# Patient Record
Sex: Female | Born: 1945 | Hispanic: Yes | Marital: Single | Smoking: Never smoker
Health system: Southern US, Community
[De-identification: ages and names within clinical notes are randomized; demographics above are authoritative.]

## PROBLEM LIST (undated history)

## (undated) DIAGNOSIS — D649 Anemia, unspecified: Secondary | ICD-10-CM

## (undated) DIAGNOSIS — R21 Rash and other nonspecific skin eruption: Secondary | ICD-10-CM

## (undated) DIAGNOSIS — R011 Cardiac murmur, unspecified: Secondary | ICD-10-CM

## (undated) DIAGNOSIS — E119 Type 2 diabetes mellitus without complications: Secondary | ICD-10-CM

## (undated) DIAGNOSIS — H538 Other visual disturbances: Secondary | ICD-10-CM

## (undated) DIAGNOSIS — M1990 Unspecified osteoarthritis, unspecified site: Secondary | ICD-10-CM

## (undated) DIAGNOSIS — Z5189 Encounter for other specified aftercare: Secondary | ICD-10-CM

## (undated) DIAGNOSIS — E785 Hyperlipidemia, unspecified: Secondary | ICD-10-CM

## (undated) DIAGNOSIS — K769 Liver disease, unspecified: Secondary | ICD-10-CM

## (undated) DIAGNOSIS — I10 Essential (primary) hypertension: Secondary | ICD-10-CM

## (undated) DIAGNOSIS — E039 Hypothyroidism, unspecified: Secondary | ICD-10-CM

## (undated) DIAGNOSIS — H269 Unspecified cataract: Secondary | ICD-10-CM

## (undated) DIAGNOSIS — H539 Unspecified visual disturbance: Secondary | ICD-10-CM

## (undated) HISTORY — PX: EYE SURGERY: SHX253

## (undated) HISTORY — PX: ABDOMINAL SURGERY: SHX537

---

## 1976-09-27 HISTORY — PX: TUBAL LIGATION: SHX77

## 2003-04-03 ENCOUNTER — Emergency Department: Admit: 2003-04-03 | Payer: Self-pay | Source: Ambulatory Visit | Admitting: Emergency Medicine

## 2003-06-04 ENCOUNTER — Ambulatory Visit
Admission: RE | Admit: 2003-06-04 | Disposition: A | Discharge status: Home / Self Care | Payer: Self-pay | Source: Ambulatory Visit | Admitting: Orthopaedic Surgery

## 2004-12-22 ENCOUNTER — Observation Stay
Admission: EM | Admit: 2004-12-22 | Disposition: A | Discharge status: Home / Self Care | Payer: Self-pay | Source: Ambulatory Visit | Admitting: Internal Medicine

## 2012-01-11 IMAGING — OT DEXA BONE DENSITY
2 series · 2 of 2 positions shown · non-contrast
Comparison: none

Patient Demographics:  66-year-old Hispanic female
REASON FOR EXAM: Postmenopausal screen

Risk Factors:   Previous fracture
Prior Exams:  None
Method:  Scans of the spine between L2-L4 and the femoral neck were performed using dual energy X-ray densitometry (DXA) with the [employer]-SL system.

[Series 1: — · 1 of 1 slices shown (1 of 2)]
[im 1/1]
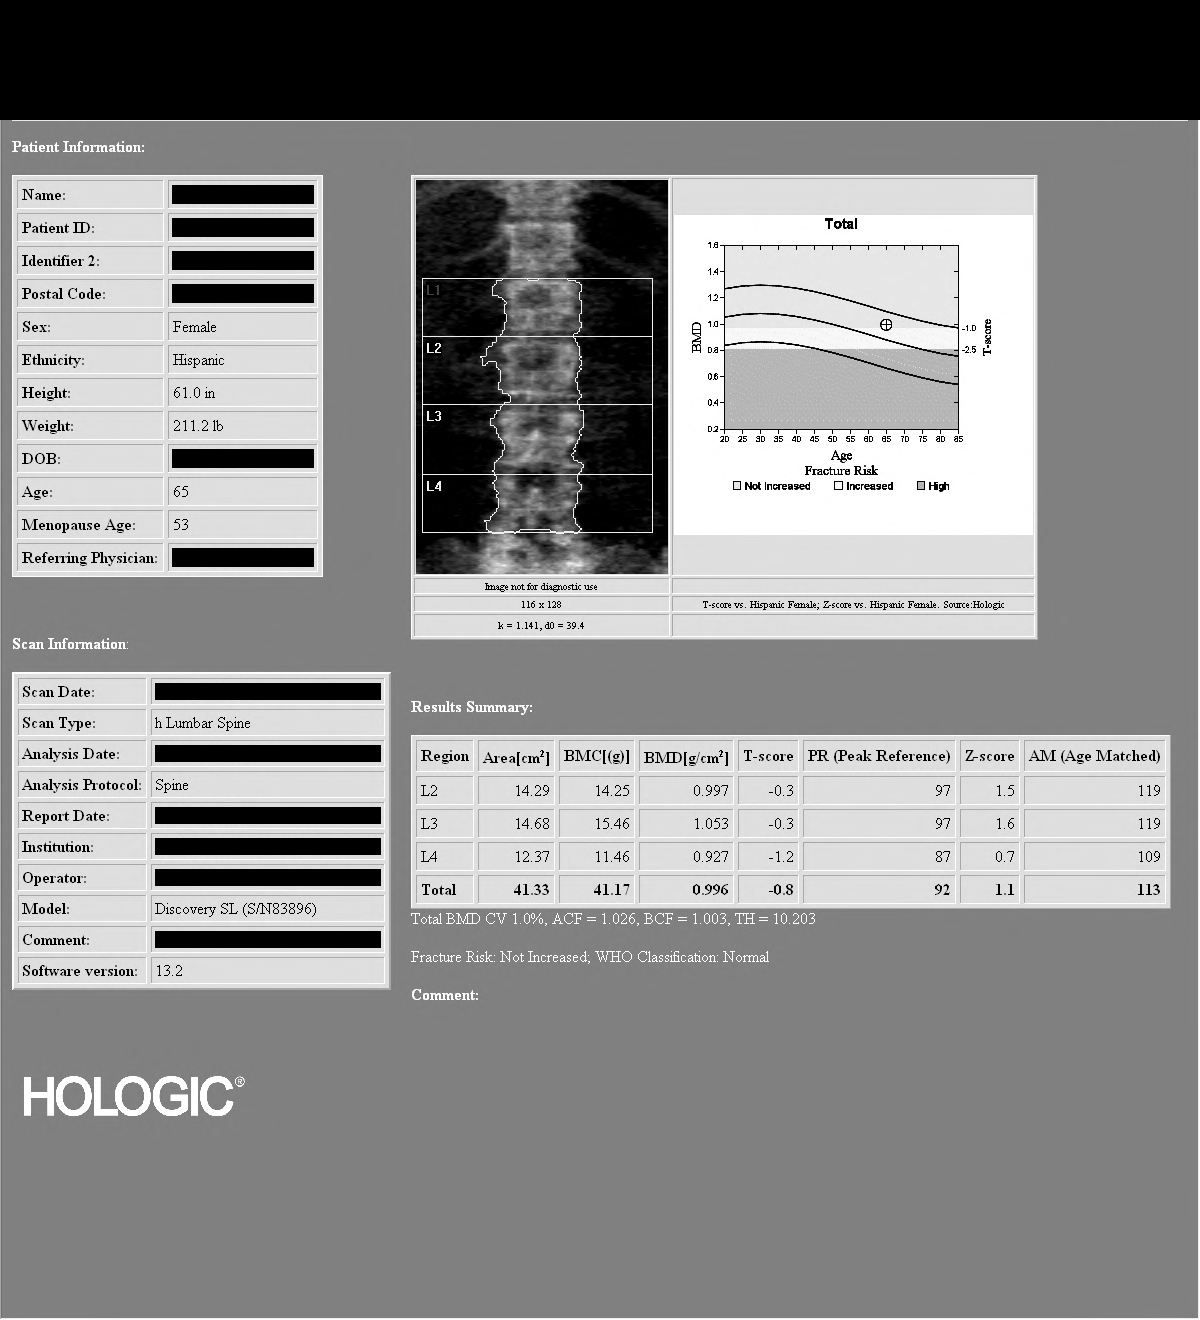

[Series 2: — · left · 1 of 1 slices shown (2 of 2)]
[im 1/1]
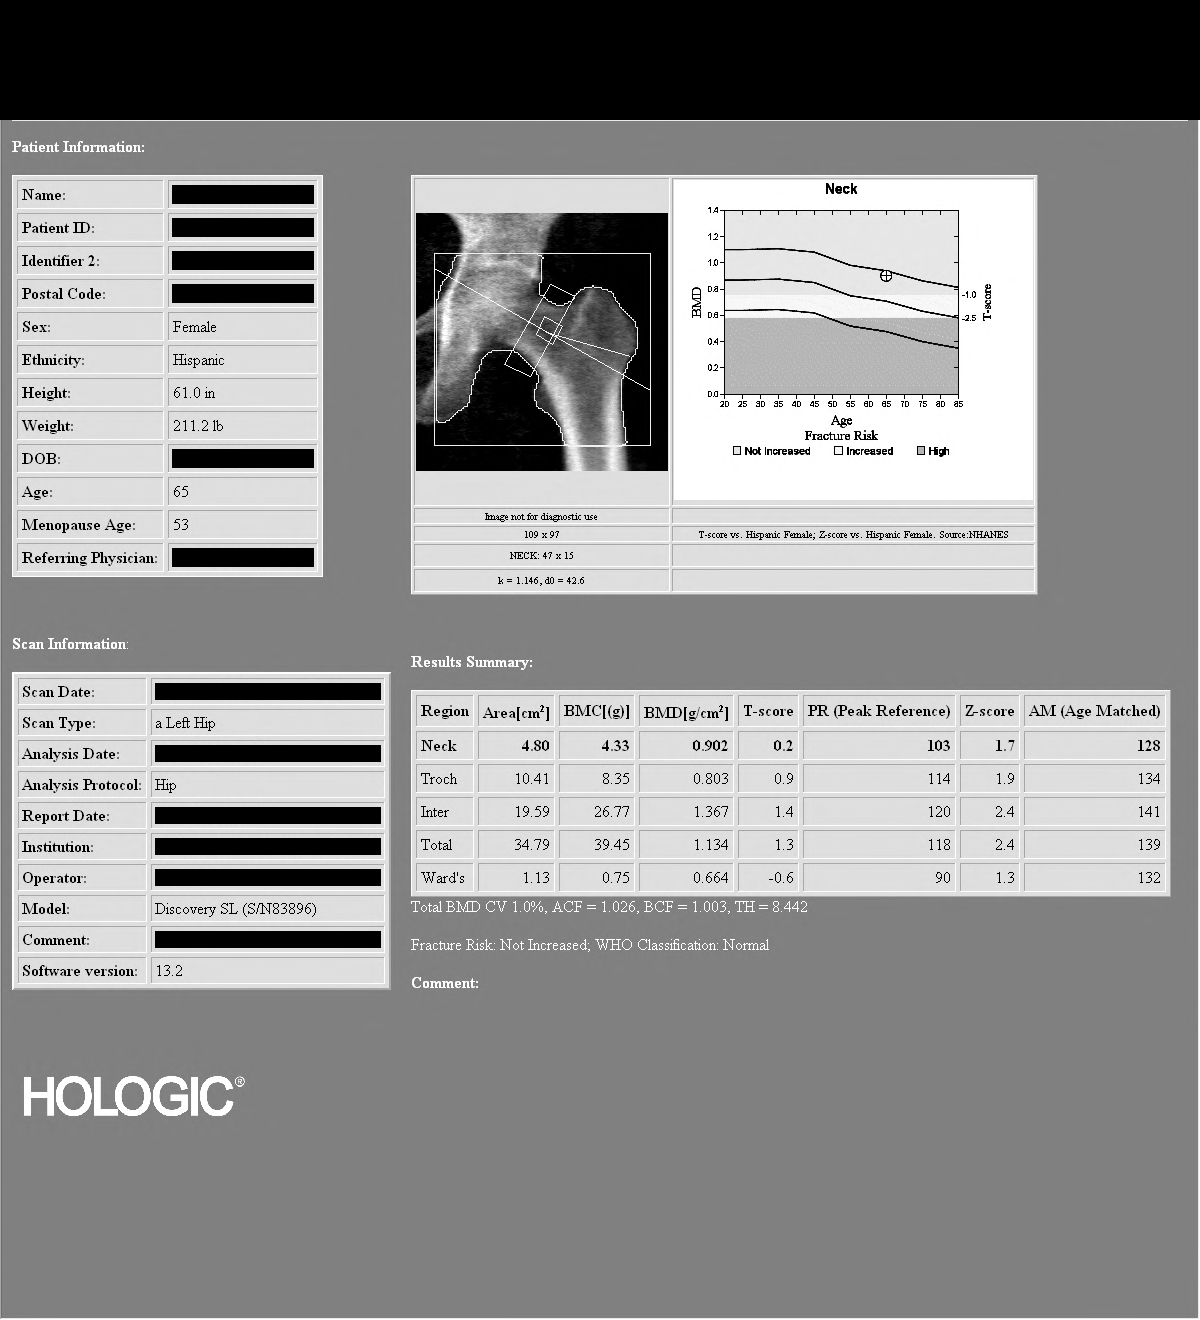

[2 of 2 positions shown; findings below may reference images not displayed]

FINDINGS: 1.    Review of scanogram images shows no factor invalidating scan results.  

2.    Lumbar spine exam shows average Bone Mineral Density is 0.996 gm/cm2 of Hydroxyapatite.  The T-score (comparing patient with a young adult group) is 0.8 standard deviation BELOW mean.  The Z-score (comparing patient with an age-matched group) is 1.1 standard deviation ABOVE mean.

3.   Left hip exam using femoral neck region of interest shows Bone Mineral Density is 0.902 gm/cm2 of Hydroxyapatite.  The T-score is 0.2 standard deviation ABOVE mean.  The Z-score is 1.7 standard deviation ABOVE mean.

Discussion:  As defined by World Health Organization, the patient meets the criteria for NORMAL BONE DENSITY based on spine and hip T-score.  

Recommendations:  In addition assuring patient receiving adequate calcium and vitamin D, which patient has indicated she is not taking supplements, regular exercise to patient tolerance would be of benefit. The patient does not smoke and is not taking any prescribed medication for bone loss.

According to criteria established by the NOF, patient DOES NOT fall into benefit category for prescribed medical therapy.

 The National Osteoporosis Foundation now recommends followup DXA scanning every two years in patients at risk regardless of whether the patient is undergoing pharmacological treatment.

World Health Organization criteria for diagnosis.  (For post-menopausal women and men over 50 years old only)

Normal Bone Mass:  T-score -1.0 and above

Low Bone Mass (Osteopenia):  T-score between -1.0 and -2.5

Osteoporosis:  T-score -2.5 and below

Recommendations for Institution of Pharmacologic Therapy National Osteoporosis Foundation 5779

Post-menopausal women and men age 50 and older presenting with the following should be treated:  

Prior hip or vertebral (clinical or morphometric) fracture.

T-score < -2.5 at the femoral neck, total hip or spine after appropriate evaluation to exclude secondary causes for osteoporosis.

Other prior fractures and low bone mass (T-score between -1.0 and -2.5 at the femoral neck, total hip or spine).

Low bone mass (T-score between -1.0 and -2.5 at the femoral neck, total hip or spine) and secondary causes associated with high risk of fracture (such as prolonged glucocorticoid use or total immobilization).

Low bone mass (T-score between -1.0 and -2.5 at the femoral neck, total hip or spine) and 10- yr of hip fracture > 3% or a 10-yr probability of any major osteoporosis-related fracture =20% based on the [place]adapted WHO algorithm.

## 2012-01-11 IMAGING — CR CHEST 2 VWS PA LAT
1 series · 2 of 2 positions shown · non-contrast
Comparison: none

HISTORY: Hypertension , diabetes

[Series 1: pa · 0.17mm/px · 2 of 2 slices shown]
[im 1/2]
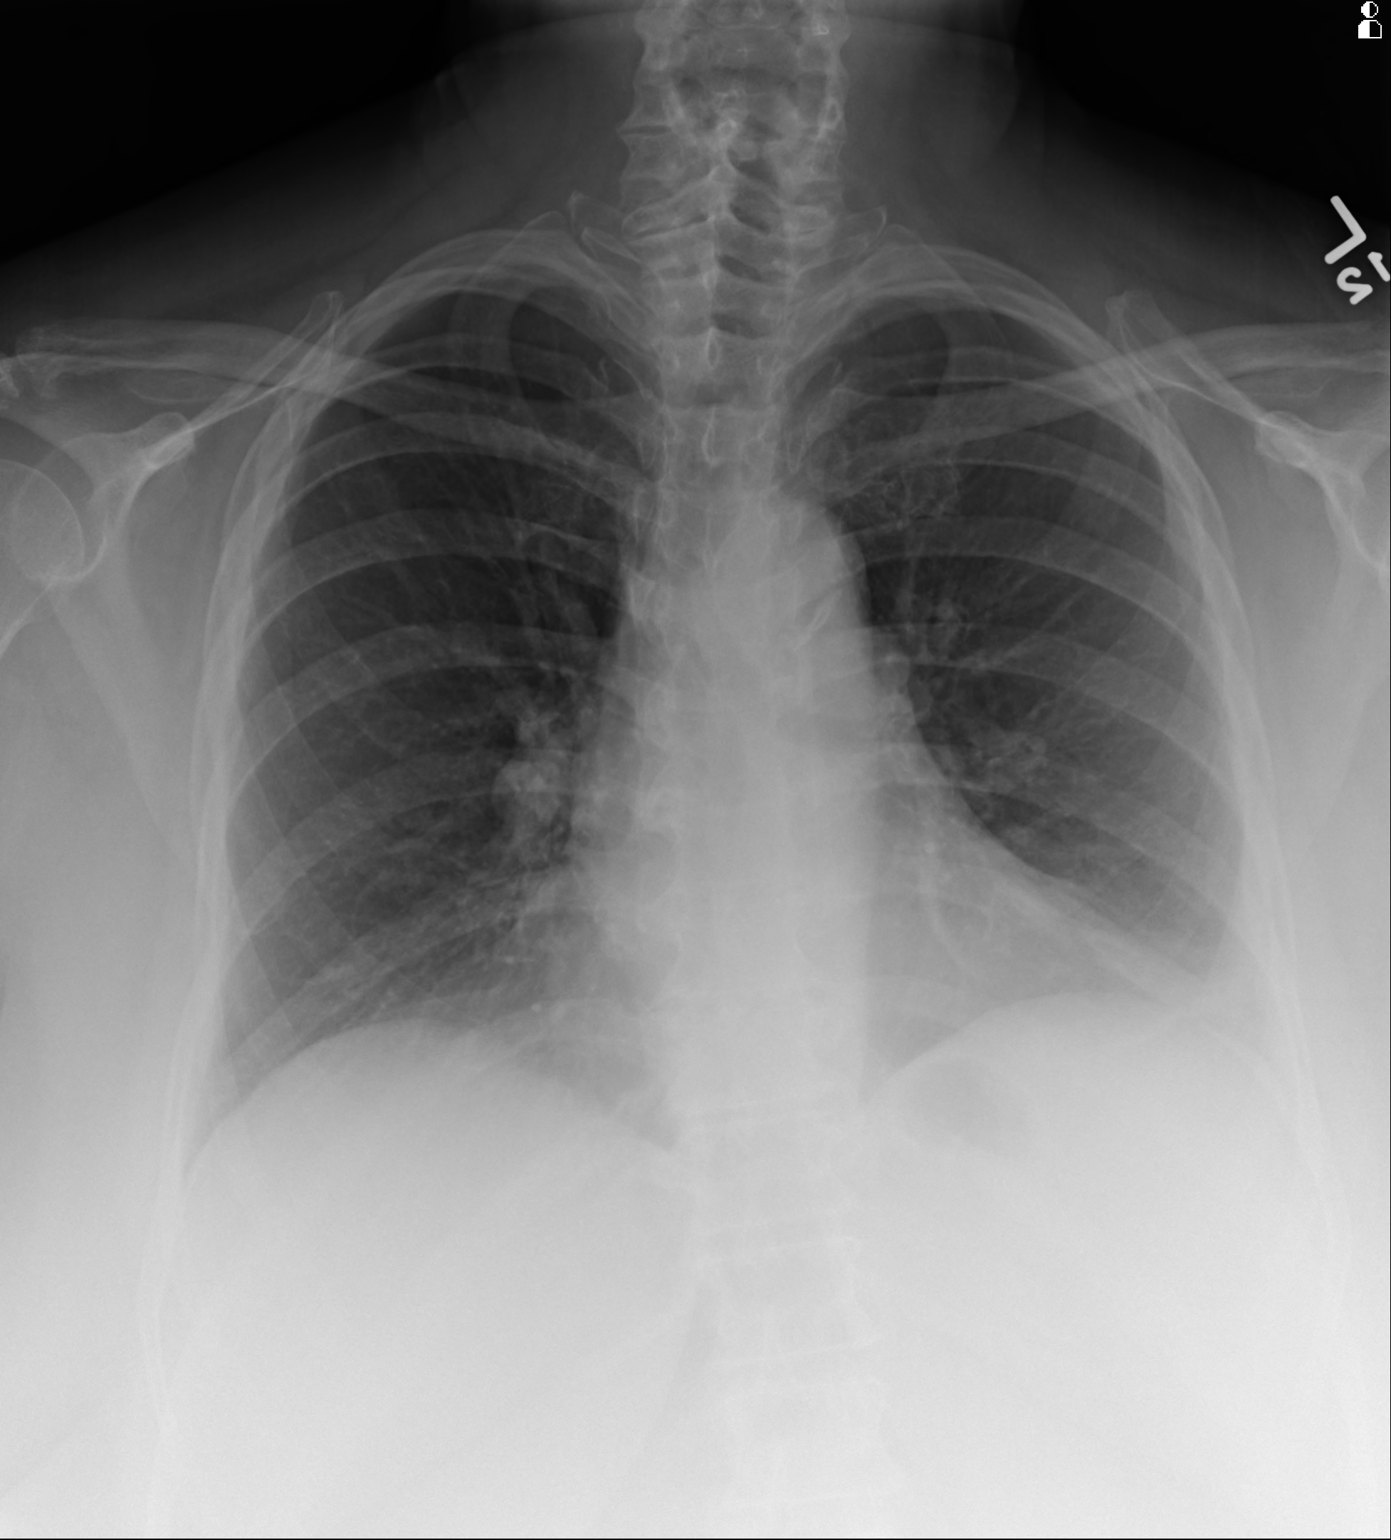
[im 2/2]
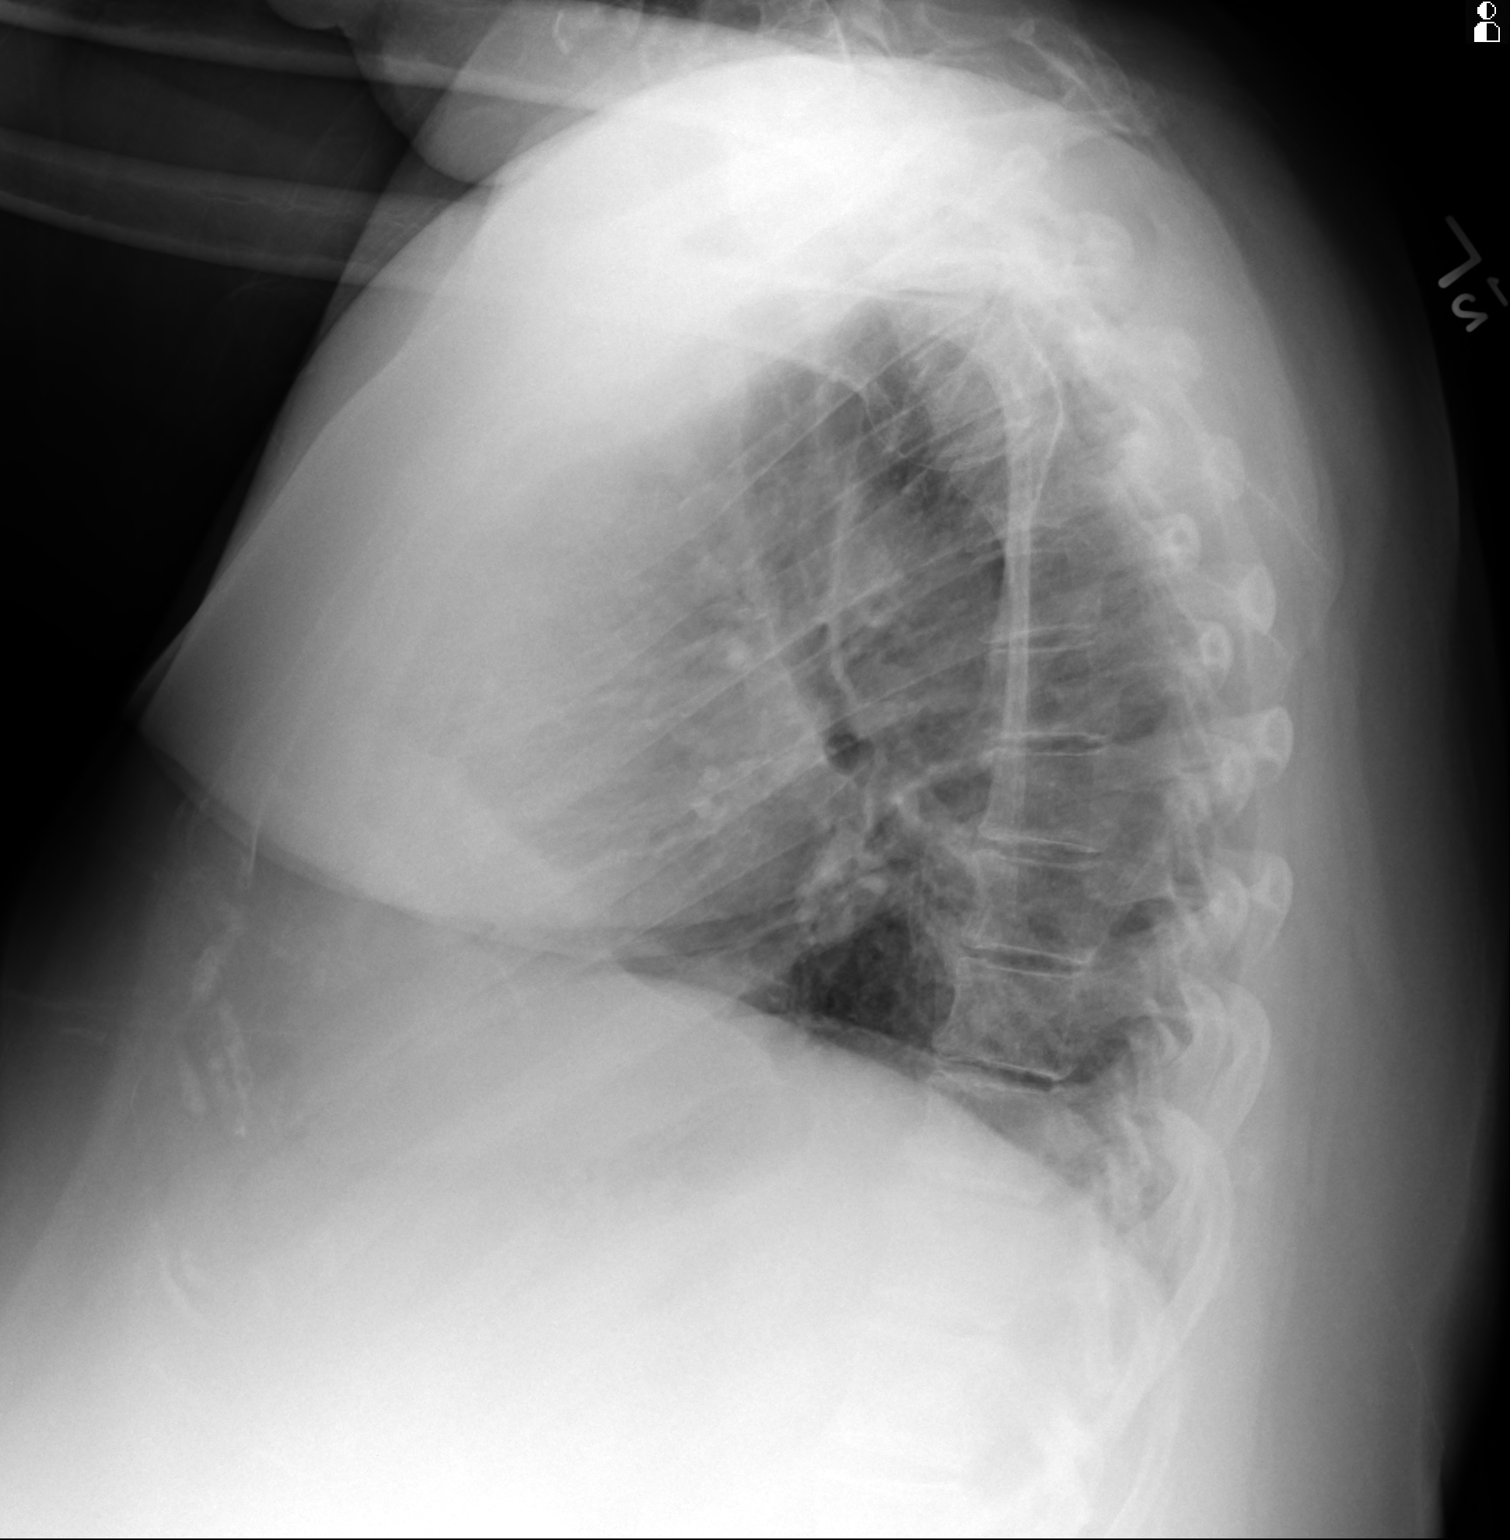

[2 of 2 positions shown; findings below may reference images not displayed]

FINDINGS: Cardiac size and pulmonary vascularity are normal.  The mediastinal silhouette is unremarkable.  The lungs are normal, and there is no pleural fluid.
IMPRESSION: : 

Normal chest.

## 2012-01-21 ENCOUNTER — Emergency Department
Admit: 2012-01-21 | Discharge: 2012-01-21 | Disposition: A | Discharge status: Home / Self Care | Payer: Self-pay | Source: Emergency Department | Admitting: Pediatric Emergency Medicine

## 2012-05-28 ENCOUNTER — Emergency Department: Payer: Medicare Other

## 2012-05-28 ENCOUNTER — Emergency Department
Admission: EM | Admit: 2012-05-28 | Discharge: 2012-05-28 | Disposition: A | Discharge status: Home / Self Care | Payer: Medicare Other | Attending: Emergency Medicine | Admitting: Emergency Medicine

## 2012-05-28 DIAGNOSIS — E079 Disorder of thyroid, unspecified: Secondary | ICD-10-CM | POA: Insufficient documentation

## 2012-05-28 DIAGNOSIS — I10 Essential (primary) hypertension: Secondary | ICD-10-CM | POA: Insufficient documentation

## 2012-05-28 DIAGNOSIS — M129 Arthropathy, unspecified: Secondary | ICD-10-CM | POA: Insufficient documentation

## 2012-05-28 DIAGNOSIS — E119 Type 2 diabetes mellitus without complications: Secondary | ICD-10-CM | POA: Insufficient documentation

## 2012-05-28 DIAGNOSIS — E785 Hyperlipidemia, unspecified: Secondary | ICD-10-CM | POA: Insufficient documentation

## 2012-05-28 DIAGNOSIS — R509 Fever, unspecified: Secondary | ICD-10-CM

## 2012-05-28 DIAGNOSIS — R42 Dizziness and giddiness: Secondary | ICD-10-CM | POA: Insufficient documentation

## 2012-05-28 HISTORY — DX: Unspecified osteoarthritis, unspecified site: M19.90

## 2012-05-28 HISTORY — DX: Essential (primary) hypertension: I10

## 2012-05-28 HISTORY — DX: Hyperlipidemia, unspecified: E78.5

## 2012-05-28 LAB — CBC AND DIFFERENTIAL
Basophils Absolute Automated: 0.02 10*3/uL (ref 0.00–0.20)
Basophils Automated: 0 % (ref 0–2)
Eosinophils Absolute Automated: 0.04 10*3/uL (ref 0.00–0.70)
Eosinophils Automated: 0 % (ref 0–5)
Hematocrit: 43.7 % (ref 37.0–47.0)
Hgb: 15 g/dL (ref 12.0–16.0)
Immature Granulocytes Absolute: 0.02 10*3/uL
Immature Granulocytes: 0 % (ref 0–1)
Lymphocytes Absolute Automated: 1.95 10*3/uL (ref 0.50–4.40)
Lymphocytes Automated: 19 % (ref 15–41)
MCH: 29.5 pg (ref 28.0–32.0)
MCHC: 34.3 g/dL (ref 32.0–36.0)
MCV: 85.9 fL (ref 80.0–100.0)
MPV: 12.1 fL (ref 9.4–12.3)
Monocytes Absolute Automated: 0.65 10*3/uL (ref 0.00–1.20)
Monocytes: 6 % (ref 0–11)
Neutrophils Absolute: 7.71 10*3/uL (ref 1.80–8.10)
Neutrophils: 74 % (ref 52–75)
Nucleated RBC: 0 /100 WBC (ref 0–1)
Platelets: 154 10*3/uL (ref 140–400)
RBC: 5.09 10*6/uL (ref 4.20–5.40)
RDW: 13 % (ref 12–15)
WBC: 10.37 10*3/uL (ref 3.50–10.80)

## 2012-05-28 LAB — URINALYSIS, REFLEX TO MICROSCOPIC EXAM IF INDICATED
Bilirubin, UA: NEGATIVE
Blood, UA: NEGATIVE
Glucose, UA: NEGATIVE
Ketones UA: NEGATIVE
Leukocyte Esterase, UA: NEGATIVE
Nitrite, UA: NEGATIVE
Protein, UR: NEGATIVE
Specific Gravity UA: 1.008 (ref 1.001–1.035)
Urine pH: 7 (ref 5.0–8.0)
Urobilinogen, UA: NEGATIVE mg/dL

## 2012-05-28 LAB — HEMOLYSIS INDEX: Hemolysis Index: 15 Index (ref 0–18)

## 2012-05-28 LAB — LACTIC ACID: Lactic Acid: 1 meq/L (ref 0.5–2.2)

## 2012-05-28 LAB — BASIC METABOLIC PANEL
Anion Gap: 13 (ref 5.0–15.0)
BUN: 14 mg/dL (ref 7.0–19.0)
CO2: 27 mEq/L (ref 22–29)
Calcium: 10.1 mg/dL (ref 8.5–10.5)
Chloride: 97 mEq/L — ABNORMAL LOW (ref 98–107)
Creatinine: 0.9 mg/dL (ref 0.6–1.0)
Glucose: 138 mg/dL — ABNORMAL HIGH (ref 70–100)
Potassium: 3.6 mEq/L (ref 3.5–5.1)
Sodium: 137 mEq/L (ref 136–145)

## 2012-05-28 LAB — GFR: EGFR: >60

## 2012-05-28 MED ORDER — ACETAMINOPHEN 500 MG PO TABS
1000.0000 mg | ORAL_TABLET | Freq: Once | ORAL | Status: AC
Start: 2012-05-28 — End: 2012-05-28
  Administered 2012-05-28: 1000 mg via ORAL
  Filled 2012-05-28: qty 2

## 2012-05-28 MED ORDER — SODIUM CHLORIDE 0.9 % IV BOLUS
1000.00 mL | Freq: Once | INTRAVENOUS | Status: AC
Start: 2012-05-28 — End: 2012-05-28
  Administered 2012-05-28: 1000 mL via INTRAVENOUS

## 2012-05-28 NOTE — ED Provider Notes (Signed)
Physician/Midlevel provider first contact with patient: 05/28/12 1731         History     Chief Complaint   Patient presents with   . Dizziness   . Fatigue   . Headache   . Numbness     The history is provided by the patient.     2 days progressive, now moderate dizziness, assocaited with headache and extremity numbness.  No identified modifying factors.    Past Medical History   Diagnosis Date   . Hypertensive disorder    . Diabetes mellitus without complication    . Hyperlipidemia    . Arthritis    . Thyroid disease        History reviewed. No pertinent past surgical history.    No family history on file.    Social  History   Substance Use Topics   . Smoking status: Not on file   . Smokeless tobacco: Not on file   . Alcohol Use:        .     No Known Allergies    Current/Home Medications    ATENOLOL-CHLORTHALIDONE (TENORETIC) 50-25 MG PER TABLET    Take 1 tablet by mouth daily.    CELECOXIB (CELEBREX) 200 MG CAPSULE    Take 200 mg by mouth 2 (two) times daily.    EZETIMIBE (ZETIA) 10 MG TABLET    Take 10 mg by mouth daily.    LEVOTHYROXINE (SYNTHROID, LEVOTHROID) 125 MCG TABLET    Take 125 mcg by mouth daily.    METFORMIN (GLUCOPHAGE) 500 MG TABLET    Take 500 mg by mouth 2 (two) times daily with meals.        Review of Systems   Constitutional: Positive for fatigue. Negative for fever and chills.   HENT: Negative for sore throat and rhinorrhea.    Eyes: Negative for pain and visual disturbance.   Respiratory: Negative for cough and shortness of breath.    Cardiovascular: Negative for chest pain and palpitations.   Gastrointestinal: Negative for nausea, vomiting, diarrhea, constipation and blood in stool.   Genitourinary: Negative for dysuria and hematuria.   Musculoskeletal: Negative for myalgias and arthralgias.   Skin: Negative for color change and rash.   Neurological: Positive for dizziness, numbness and headaches. Negative for weakness.       Physical Exam    BP 122/50  Pulse 88  Temp 101.4 F (38.6 C)   Resp 20  Wt 79.379 kg  SpO2 95%    Physical Exam   Constitutional: She is oriented to person, place, and time. She appears well-developed and well-nourished. No distress.   HENT:   Head: Normocephalic and atraumatic.   Mouth/Throat: Oropharynx is clear and moist.   Eyes: Conjunctivae normal and EOM are normal. Pupils are equal, round, and reactive to light. Right eye exhibits no discharge. Left eye exhibits no discharge. No scleral icterus.   Neck: Normal range of motion. Neck supple. No tracheal deviation present.   Cardiovascular: Normal rate, regular rhythm and normal heart sounds.  Exam reveals no gallop and no friction rub.    No murmur heard.  Pulmonary/Chest: Effort normal and breath sounds normal. No stridor. No respiratory distress. She has no wheezes. She has no rales.   Abdominal: Soft. She exhibits no distension. There is no tenderness.   Musculoskeletal: She exhibits no edema and no tenderness.   Lymphadenopathy:     She has no cervical adenopathy.   Neurological: She is alert and oriented to  person, place, and time.   Skin: Skin is warm and dry. No rash noted.   Psychiatric: She has a normal mood and affect. Her behavior is normal. Judgment and thought content normal.       MDM and ED Course     ED Medication Orders      Start     Status Ordering Provider    05/28/12 2345   acetaminophen (TYLENOL) tablet 1,000 mg   Once      Route: Oral  Ordered Dose: 1,000 mg         Acknowledged Nicholes Rough    05/28/12 1915   sodium chloride 0.9 % bolus 1,000 mL   Once      Route: Intravenous  Ordered Dose: 1,000 mL         Last MAR action:  Stopped Latasia Silberstein                 MDM    Fever of unclear etiology.  Doubt UTI, pneumonia.  Felt much better after fluids.  Has good f/u, and seemed to understand the need to return if worse.    Procedures    EKG ([place] [facility]):  NSR at 77.  Nl axis.  LVH w/ repol.  Nl intervals.  Abnormal EKG.    Clinical Impression & Disposition     Clinical Impression  Final  diagnoses:   Fever   Diabetes mellitus type 2, uncomplicated   Dizziness        ED Disposition     Discharge Leitha Bleak discharge to home/self care.    Condition at discharge: Good             New Prescriptions    No medications on file               Nicholes Rough, MD  06/02/12 2021

## 2012-05-28 NOTE — Discharge Instructions (Signed)
    Fiebre de origen desconocido      Fever Unclear Etiology   1.   Usted ha sido atendido por una fiebre. La causa de su fiebre aún no se ha determinado. 1.   You have been seen for fever. The cause of your fever is not yet known.                 2.   Muchas fiebres son causadas por virus (por ejemplo el virus del resfriado). Estas fiebres se irán cuando su cuerpo se deshaga del virus; generalmente después de unos cuantos días a una semana. 2.   Fevers are often caused by viruses (such as the cold virus). These fevers will go away when your body gets rid of the virus; usually after a few days to a week.                 3.   Hay muchas otras causas posibles de fiebre. Ésta es una lista de algunas de las posibles causas: 3.   There are many other possible causes of fever. This is a list of some of the other possible causes:   Infección.   * Infection.   Inflamación.   * Inflammation.   Ciertos tipos de cáncer (especialmente linfoma).   * Certain types of cancer (especially lymphoma).   Problemas metabólicos (problemas con la manera en que trabajan las células de su cuerpo).    * Metabolic problems (problems with the way the body’s cells work).   Ciertas toxinas, drogas y medicamentos.   * Certain poisons, drugs and medicines.                 4.   El tratamiento de la fiebre generalmente se enfoca en ayudarle a que se sienta mejor y en hacer que desaparezcan los dolores corporales, de cabeza, etc. Generalmente una fiebre se trata con acetaminofeno (Tylenol) o ibuprofeno (Advil/Motrin). Se pueden necesitar tratamientos adicionales, dependiendo de lo que el médico crea que es la causa. 4.   The main goal of treating a fever is to help you feel better and make body aches, headache, etc. go away. Acetaminophen (Tylenol) or ibuprofen (Advil/Motrin) are usually used to treat a fever. Other treatments may be needed, depending on what the doctor thinks is wrong.                 5.   Debido a que la fiebre provoca una gran  pérdida de líquidos, se puede deshidratar muy fácilmente. Manténgase bien hidratado bebiendo muchos líquidos. No beba gaseosas u otras bebidas con azúcar o con cafeína. Evite el alcohol y otros fármacos. 5.   Because fever causes you to lose extra water, you can become dehydrated very easily. Rest. Keep well-hydrated by drinking a lot of fluids. Do not drink soda, pop or other sugary drinks or drinks that have caffeine. Avoid alcohol and other drugs.                 6.   El médico cree que ya se puede ir a la casa sin riesgo. 6.   The doctor believes that it is safe for you to be discharged to go home.                 7.   Es MUY IMPORTANTE que su médico familiar lo atienda por su fiebre. También puede ver al médico que le recomendó el personal médico.  7.   It is VERY IMPORTANT that you see your family doctor about your fever. You can also see the physician the medical staff may have referred you to.                 8.   DEBE BUSCAR ATENCIÓN MÉDICA INMEDIATAMENTE, AQUÍ O EN LA SALA DE EMERGENCIAS MÁS CERCANA, SI SE PRESENTA CUALQUIERA DE LAS SIGUIENTES SITUACIONES: 8.     YOU SHOULD SEEK MEDICAL ATTENTION IMMEDIATELY, EITHER HERE OR AT THE NEAREST EMERGENCY DEPARTMENT, IF ANY OF THE FOLLOWING OCCURS:   Fiebre alta que dura más de 5 días.    * High fevers which are lasting for more than 5 days.   Dolor de cabeza y cuello rígido.   * Headache and stiff neck.   Debilidad, vértigo, desmayo.   * Weakness, dizziness, passing out.   Confusión, desorientación o cualquier cosa que parezca una convulsión (un ataque).    * Confusion, disorientation or anything that looks like convulsions (a seizure).   Síntomas de deshidratación (boca seca, labios agrietados, mareos al estar de pie, no producir mucha orina).   * Signs of dehydration (dry mouth, cracked lips, lightheadedness when standing, not making much urine).   Dolores de pecho o del abdomen.   * Chest or abdominal pains.   Dificultad al respirar o falta de aire.   *  Difficulty breathing or shortness of breath.   Sarpullidos o moretones o sangrado sin explicación.    * Rashes or bruising or bleeding for no reason.   - Se siente peor o tiene otros nuevos síntomas que le preocupan.    * Feeling worse or other new, concerning symptoms.

## 2012-05-28 NOTE — ED Notes (Signed)
Pt is c/o dizziness that started yesterday and today she has a headache w/ extremity numbness and generalized weakness.

## 2012-05-30 LAB — ECG 12-LEAD
Atrial Rate: 77 {beats}/min
P Axis: 7 degrees
P-R Interval: 160 ms
Q-T Interval: 362 ms
QRS Duration: 72 ms
QTC Calculation (Bezet): 409 ms
R Axis: 27 degrees
T Axis: 130 degrees
Ventricular Rate: 77 {beats}/min

## 2012-09-29 NOTE — Discharge Summary (Unsigned)
ATTENDING MD:  Andi Hence, MD      ADMITTED:      12/22/2004      DISCHARGED:    12/23/2004            ADMISSION DIAGNOSIS:  Chest pain.            _____ DIAGNOSES      1.   Atypical chest discomfort.      2.   Transaminitis.            HOSPITAL COURSE:  The patient is a 67 year old woman who is admitted with      chest pain.  She had a normal EKG.  Serial cardiac enzymes were also      negative.  The patient was seen by cardiology.  An exercise stress test was      performed.  The results were negative for inducible ischemia.  The patient      was chest pain free throughout the hospital course and remained stable.      With the patient's transaminitis, an ultrasound was obtained of the      abdomen.  The ultrasound did not show any evidence of gallstones or biliary      dilatation.  There was an unremarkable sonographic examination of the      liver.            DISCHARGE DISPOSITION:  The patient was discharged home in stable condition      with instructions to follow up with her primary care physician, Dr. _____.      The patient had hepatitis serology on the day of discharge.  Will follow up      on these studies.                                          ___________________________________     Date Signed: _______________      Andi Hence, MD                  D 12/24/2004  2:15 P; T 12/24/2004  3:11 P; 1610 - - , [id number], #[id number]      CC:  Andi Hence, MD

## 2013-03-13 IMAGING — MG MAMMO SCRN BIL W/CAD
6 series · 6 of 6 positions shown · non-contrast
Comparison: none

Images Obtained from [HOSPITAL] Imaging
CLINICAL RA REF: Mammo Scrn Bilateral (Digital) W/ Cad Routine.
Comparison is made to exams dated:  01/11/2012 [HOSPITAL] [HOSPITAL], 01/26/2012 [HOSPITAL] [facility], and 02/19/2003.
There are scattered fibroglandular elements in both breasts.
Current study was also evaluated with a Computer Aided Detection (CAD) system.
No significant abnormalities are seen in either breast.
There has been no significant interval change.

[R CC]
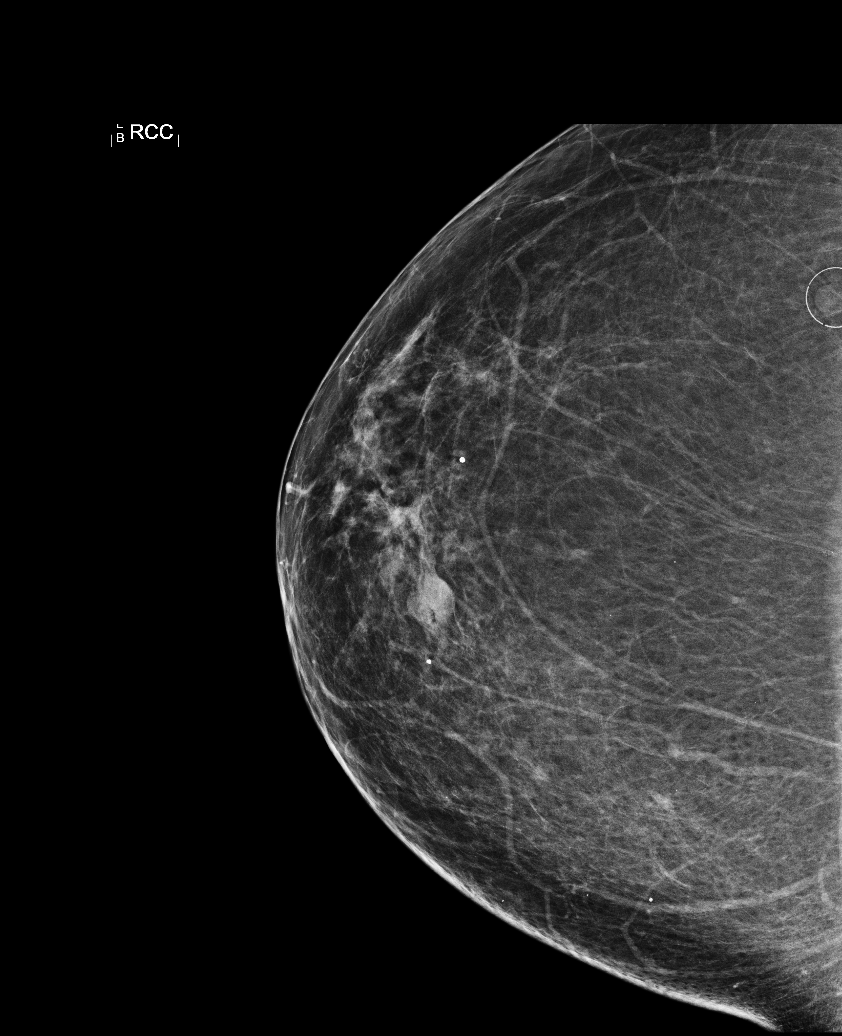

[L CC (1 of 2)]
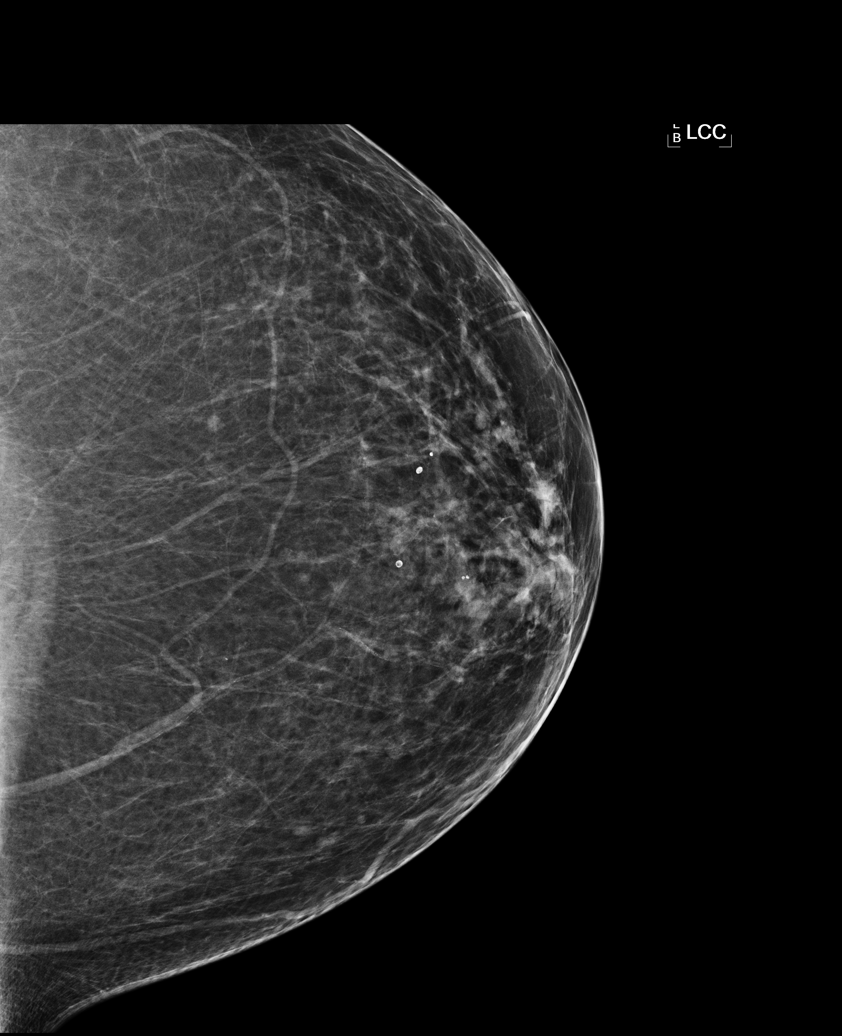

[R MLO]
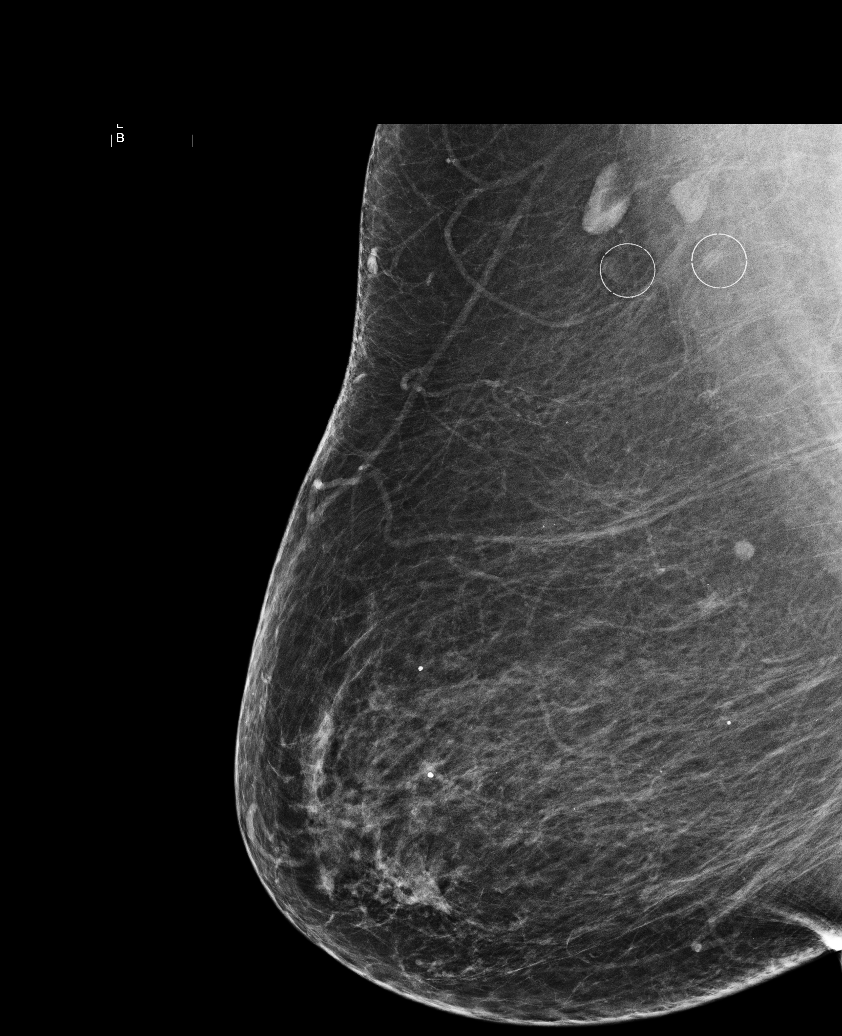

[L MLO (1 of 2)]
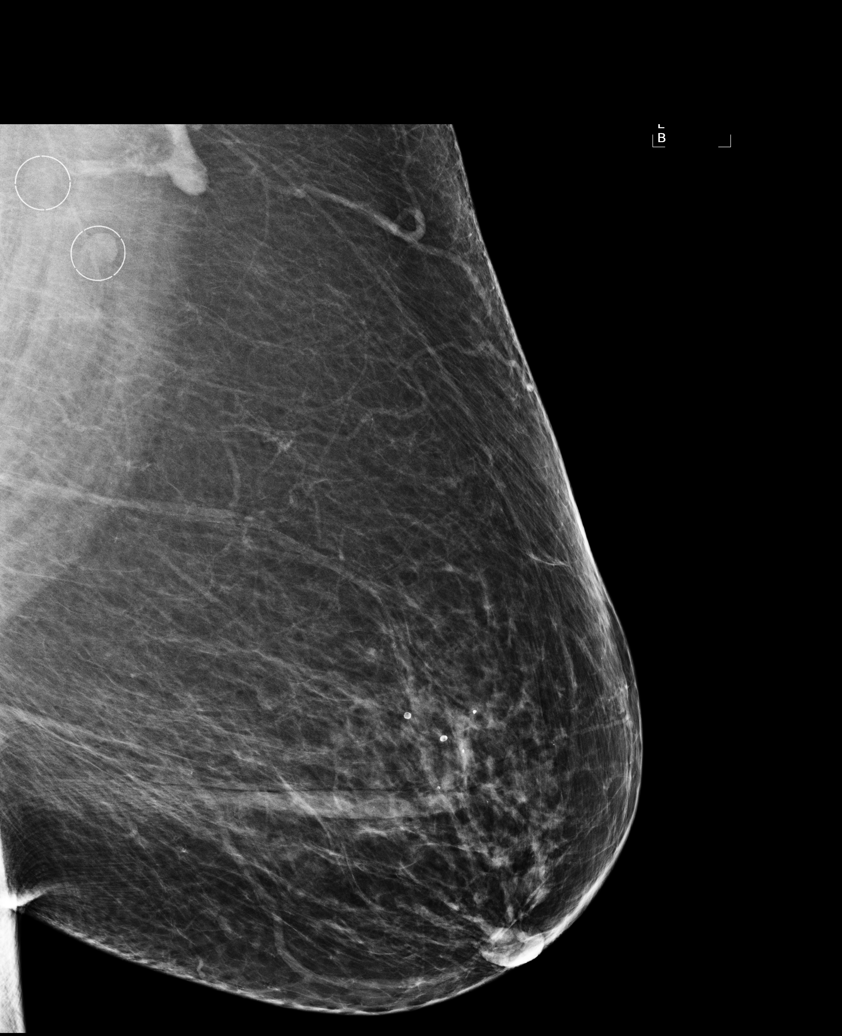

[L CC (2 of 2)]
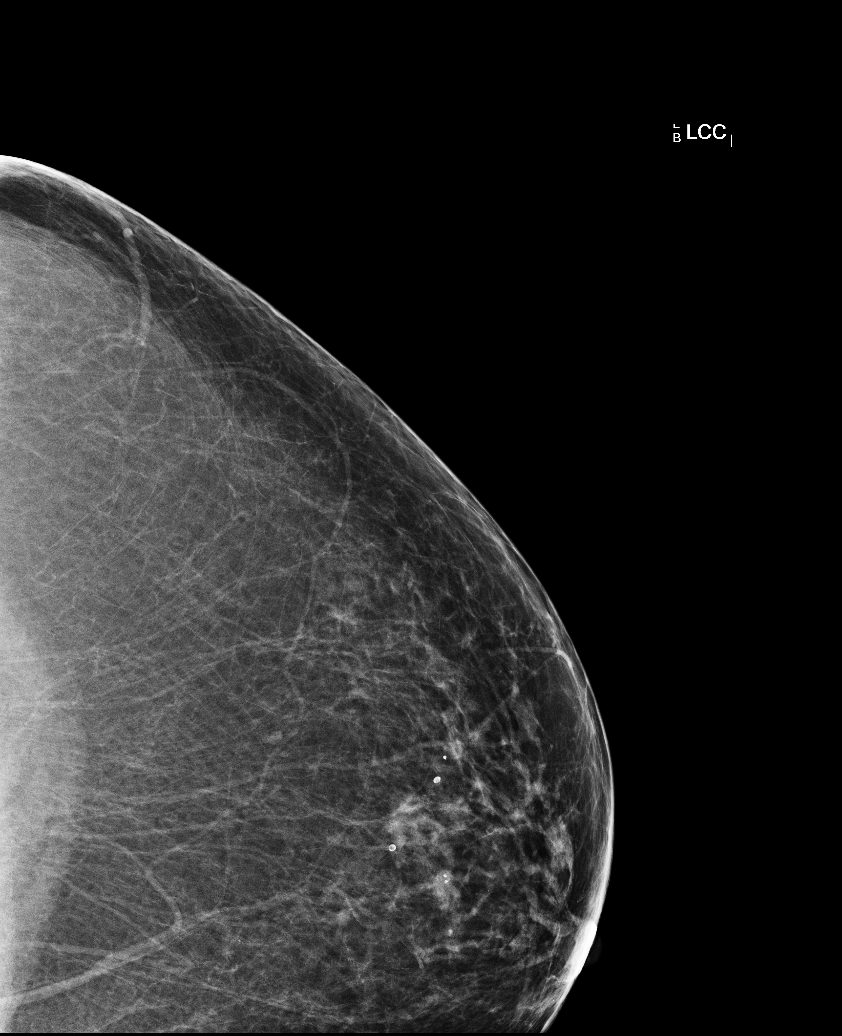

[L MLO (2 of 2)]
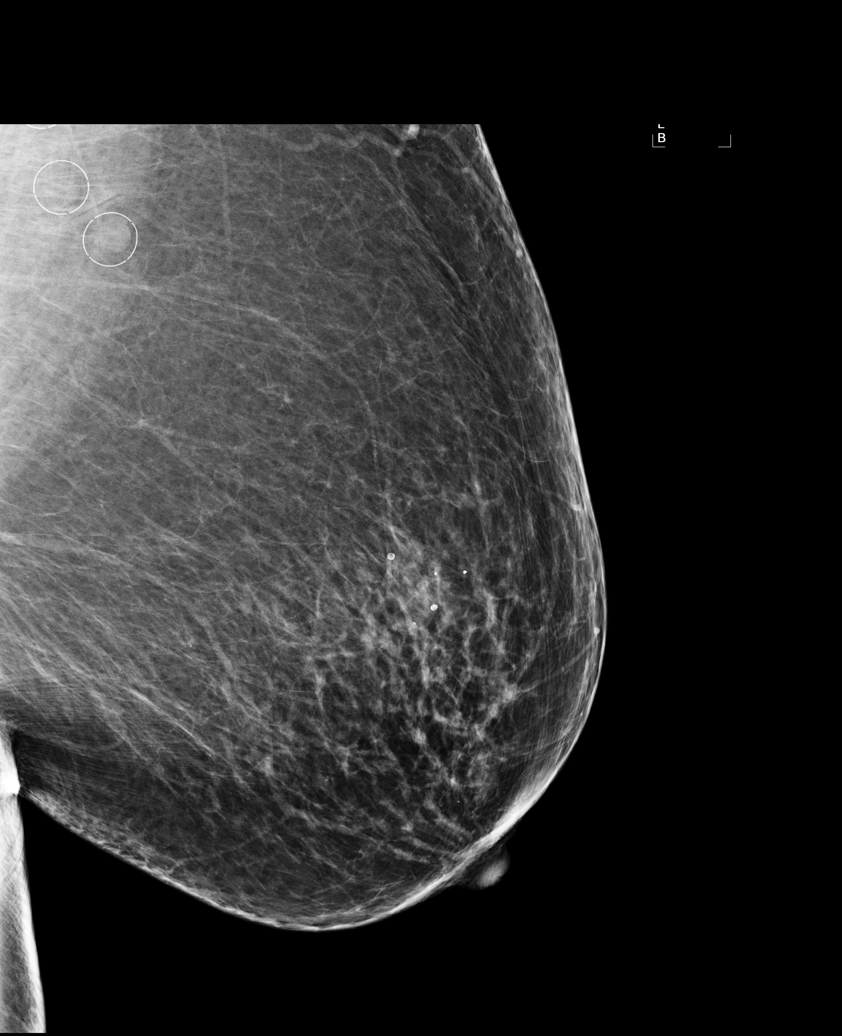

[6 of 6 positions shown; findings below may reference images not displayed]

IMPRESSION: There is no mammographic evidence of malignancy.  A 1 year screening mammogram is recommended.
mwm/penrad:03/16/2013 [DATE]
letter sent: Normal
Mammogram BI-RADS: 2 Benign   AQHQH v98.09

## 2013-04-27 IMAGING — US US RENAL RETRO COMPLETE
1 series · 14 of 25 positions shown · non-contrast
Comparison: None.

HISTORY: Left flank pain.
TECHNIQUE: Renal ultrasound.

[Series 1: us renal retro complete · 0.33mm/px · 14 of 44 slices shown]
[im 1/44]
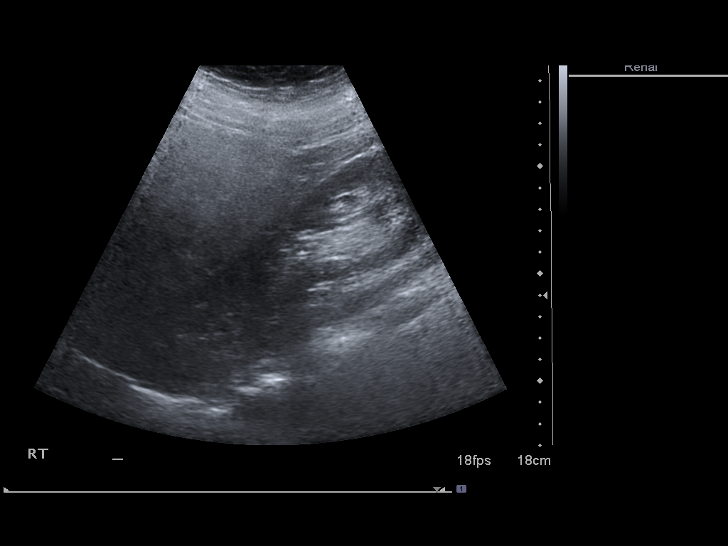
[im 4/44]
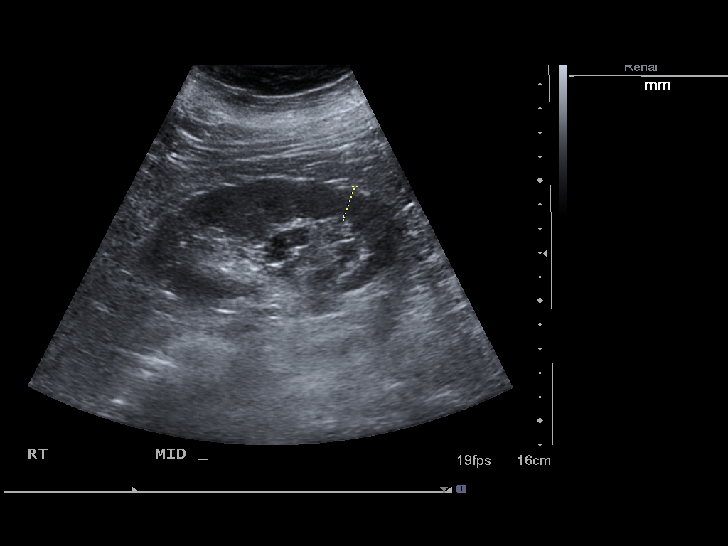
[im 8/44]
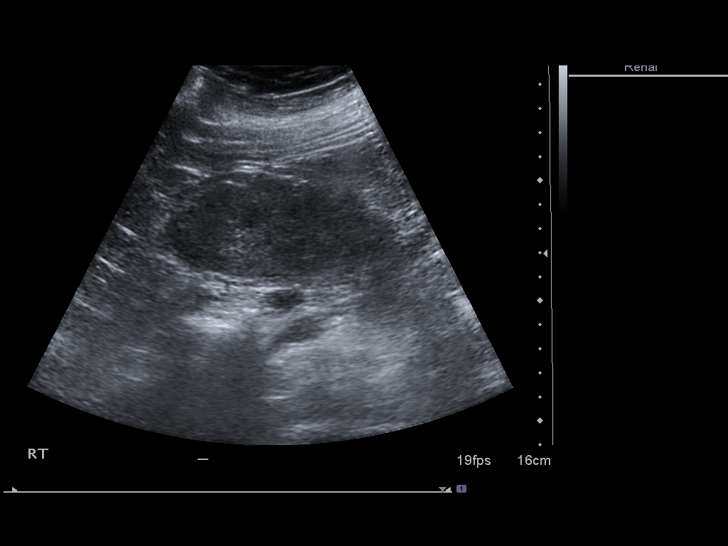
[im 11/44]
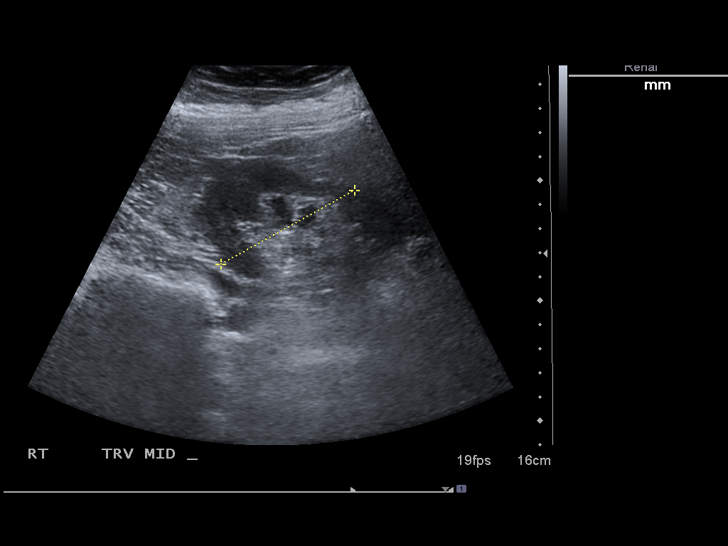
[im 15/44]
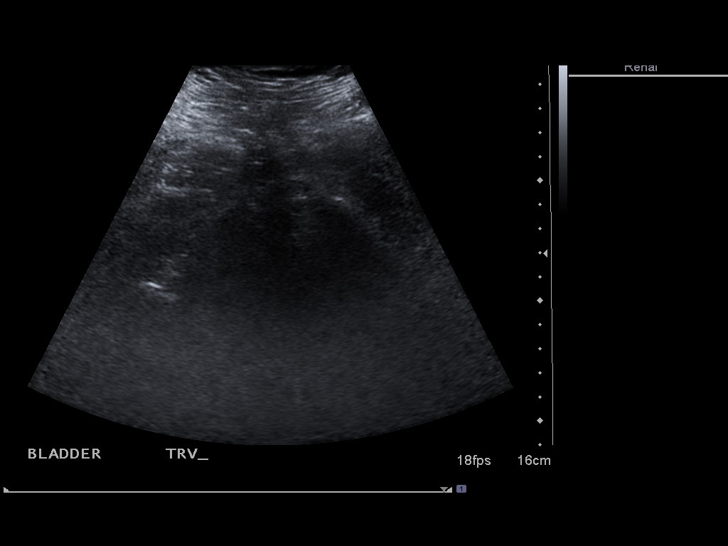
[im 17/44]
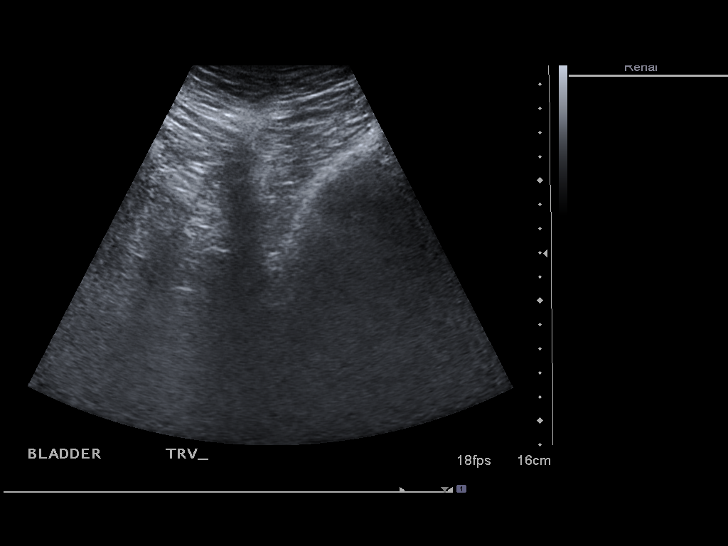
[im 20/44]
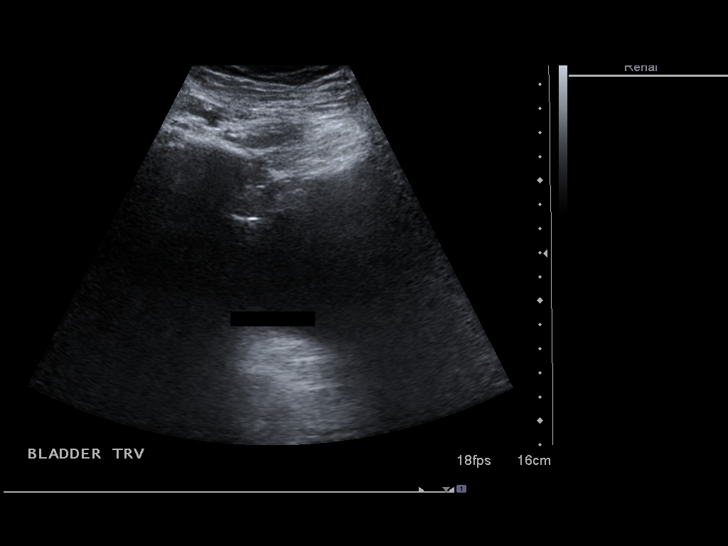
[im 24/44]
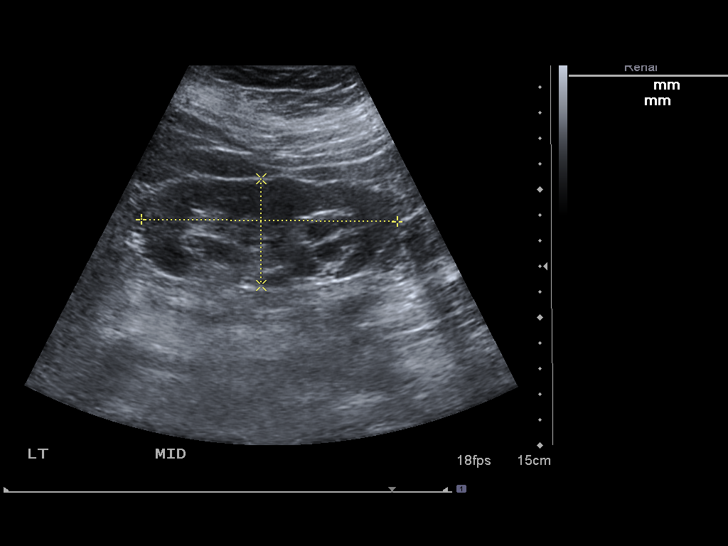
[im 27/44]
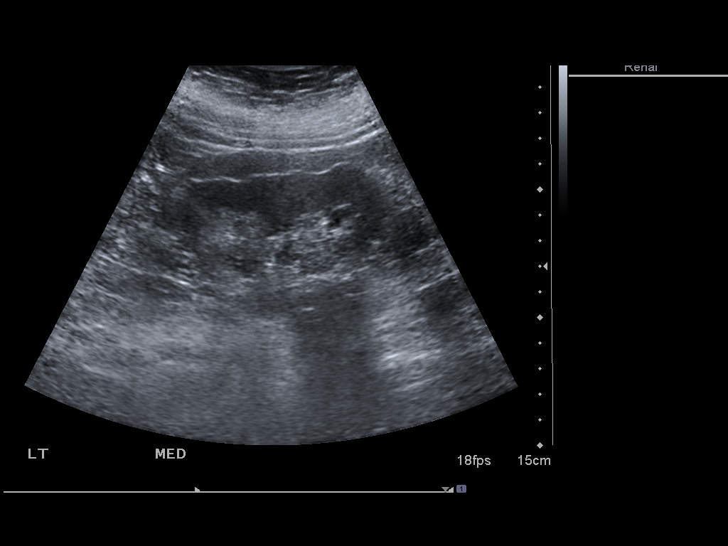
[im 29/44]
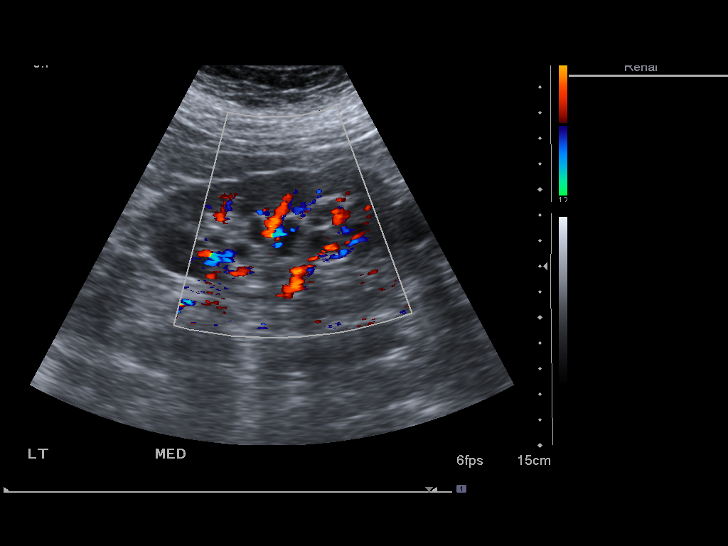
[im 33/44]
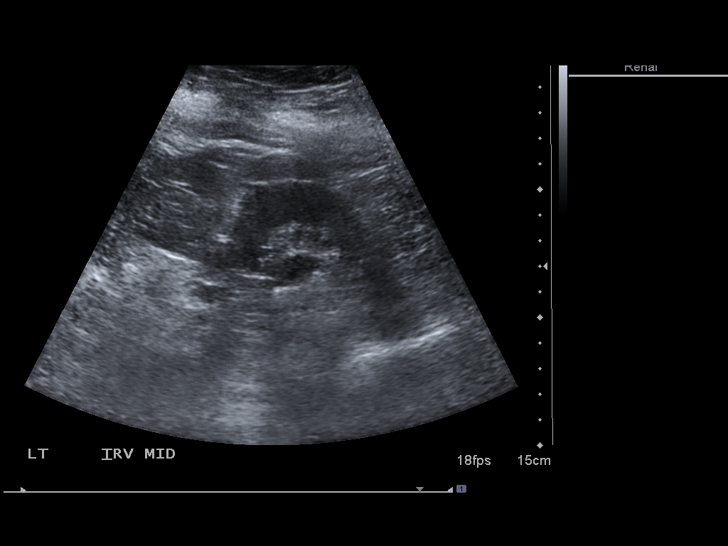
[im 36/44]
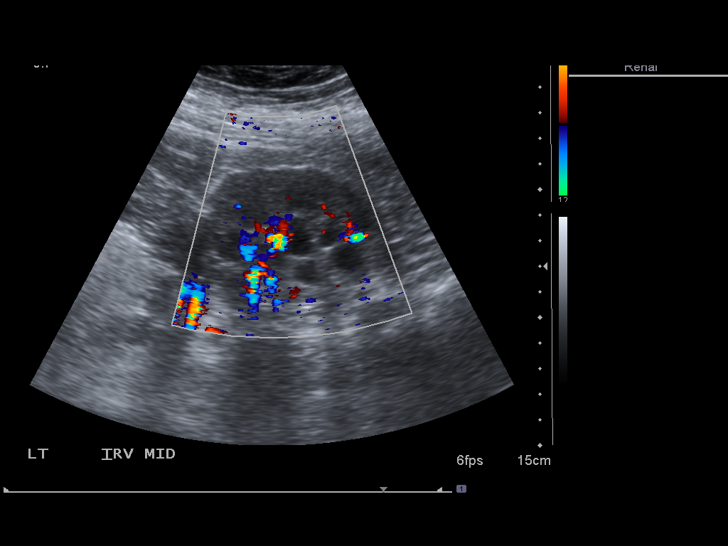
[im 40/44]
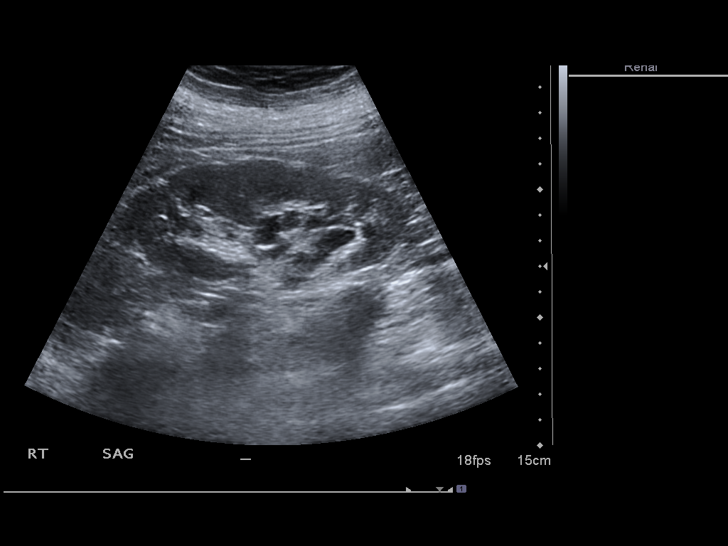
[im 44/44]
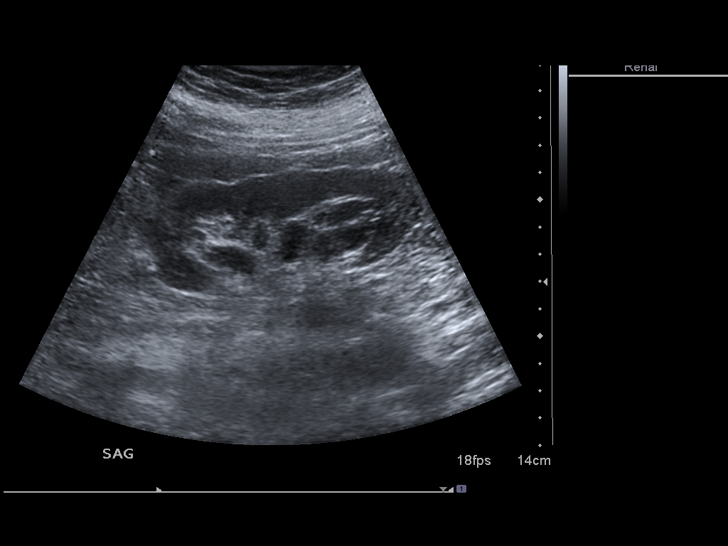

[14 of 25 positions shown; findings below may reference images not displayed]

FINDINGS: Right kidney measures 113 x 50 x 64 mm, cortex 14 mm. Mild hydronephrosis seen, this remained postvoid.

Left kidney measures 100 x 42 x 53 mm. Cortex 11 mm. There is a lateral cyst of 18 x 17 mm. The left kidney also shows mild hydronephrosis.

Urinary bladder is obscured by bowel and is not seen well.
IMPRESSION: 1. Bilateral mild hydronephrosis.

2. Normal renal size.

3. Urinary bladder is not seen well.

## 2013-11-28 ENCOUNTER — Encounter (INDEPENDENT_AMBULATORY_CARE_PROVIDER_SITE_OTHER): Payer: Self-pay

## 2014-08-29 ENCOUNTER — Other Ambulatory Visit: Payer: Self-pay | Admitting: Family Medicine

## 2014-08-29 DIAGNOSIS — Z1231 Encounter for screening mammogram for malignant neoplasm of breast: Secondary | ICD-10-CM

## 2014-08-29 DIAGNOSIS — Z13820 Encounter for screening for osteoporosis: Secondary | ICD-10-CM

## 2014-08-30 ENCOUNTER — Ambulatory Visit: Payer: Medicare Other

## 2014-08-30 ENCOUNTER — Ambulatory Visit: Payer: Medicare Other | Attending: Family Medicine

## 2014-08-30 ENCOUNTER — Other Ambulatory Visit: Payer: Self-pay | Admitting: Family Medicine

## 2014-08-30 DIAGNOSIS — Z1231 Encounter for screening mammogram for malignant neoplasm of breast: Secondary | ICD-10-CM | POA: Insufficient documentation

## 2014-08-30 DIAGNOSIS — M810 Age-related osteoporosis without current pathological fracture: Secondary | ICD-10-CM

## 2014-08-30 DIAGNOSIS — M85852 Other specified disorders of bone density and structure, left thigh: Secondary | ICD-10-CM | POA: Insufficient documentation

## 2015-07-22 IMAGING — MG MAMMO SCRN BIL W/CAD TOMO
6 of 10 series · 6 of 26 positions shown · non-contrast
Comparison: none

Images Obtained from [HOSPITAL] Imaging
CLINICAL RA REF: Mammo Scrn bilateral (Digital) W/Cad Routine with Tomosynthesis.
Digital images were generated from the 3D Tomosynthesis data acquired during the exam.
Comparison is made to exams dated:  03/13/2013, 01/11/2012 [HOSPITAL] - [HOSPITAL], and 01/26/2012 [facility] - [HOSPITAL].
There are scattered fibroglandular elements in both breasts.
Current study was also evaluated with a Computer Aided Detection (CAD) system.
No significant masses, calcifications, or other findings are seen in either breast.
There has been no significant interval change.

[R CC (1 of 2)]
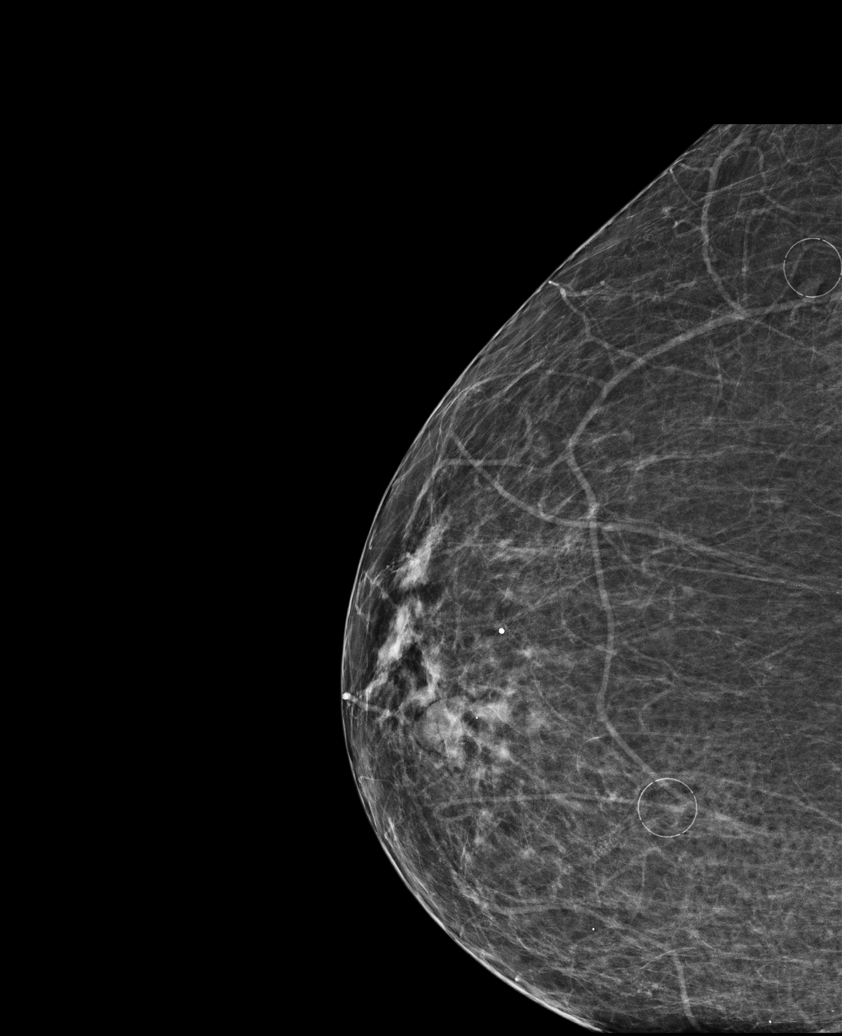

[L CC (1 of 2)]
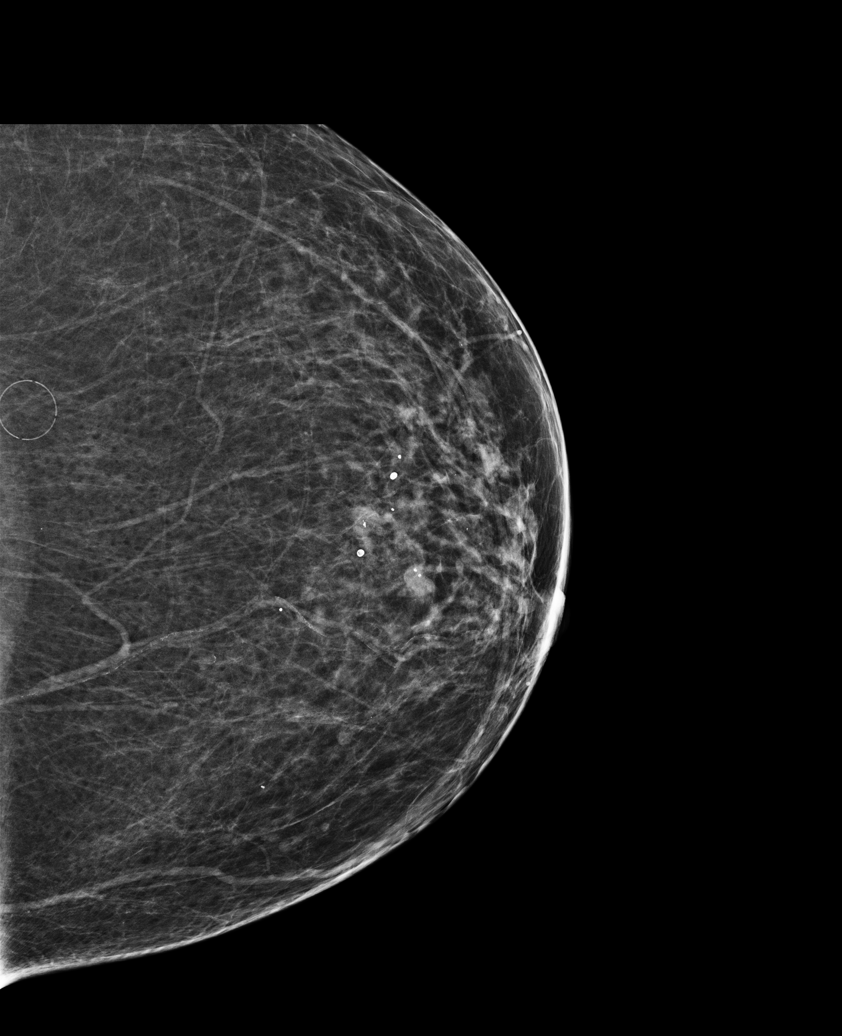

[L MLO]
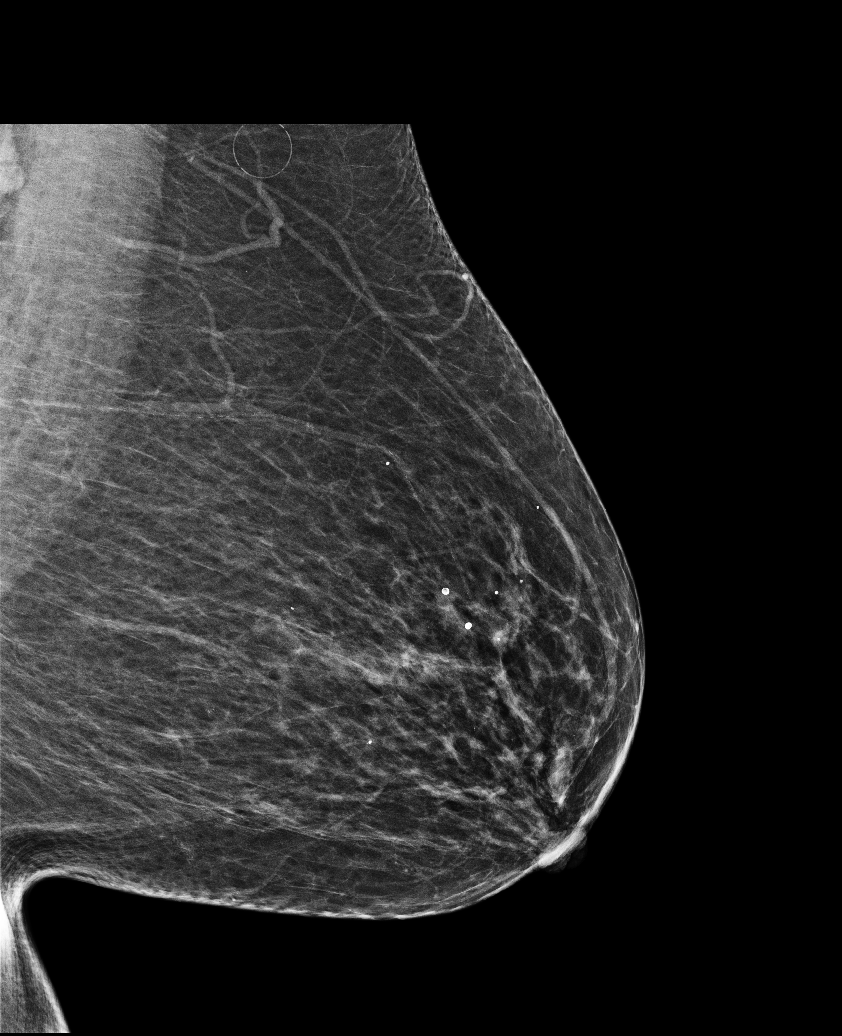

[L CC (2 of 2)]
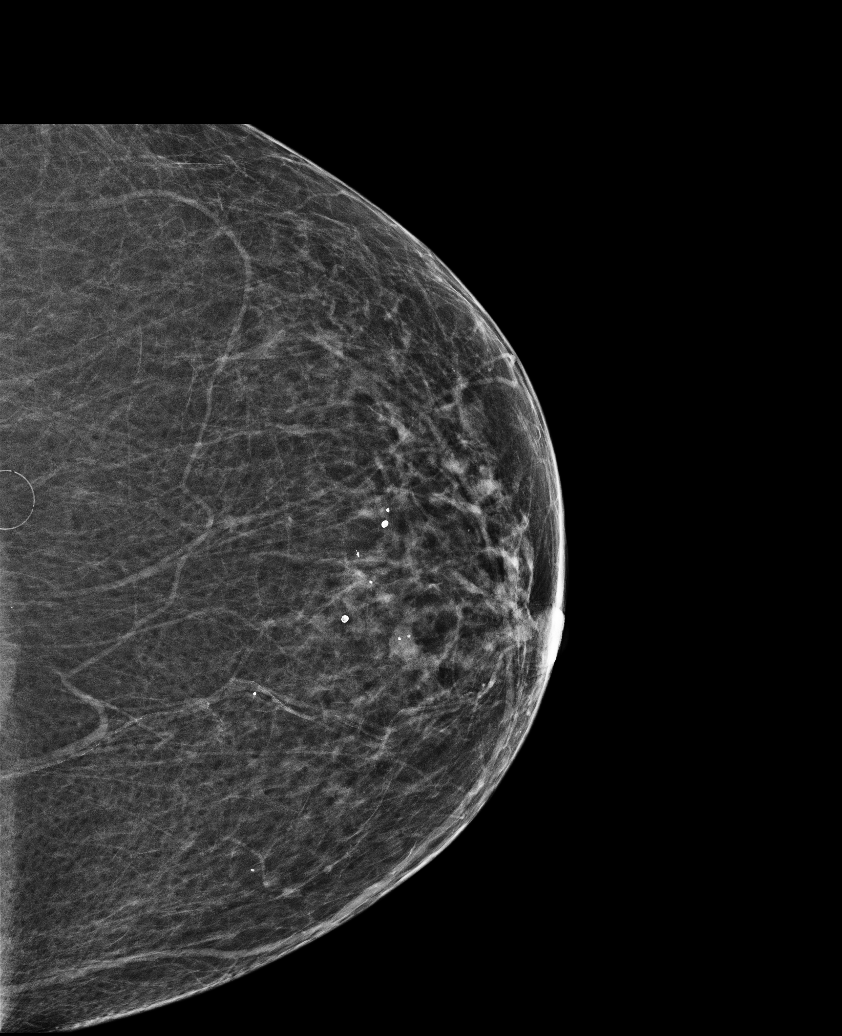

[R MLO]
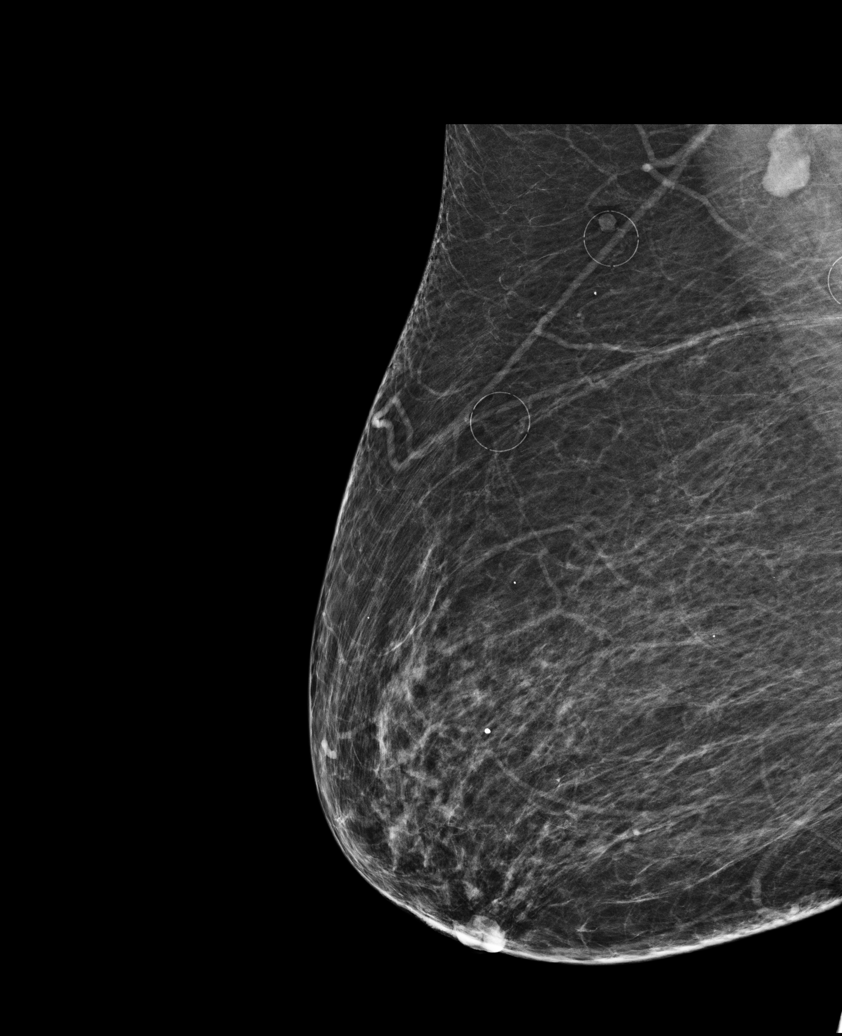

[R CC (2 of 2)]
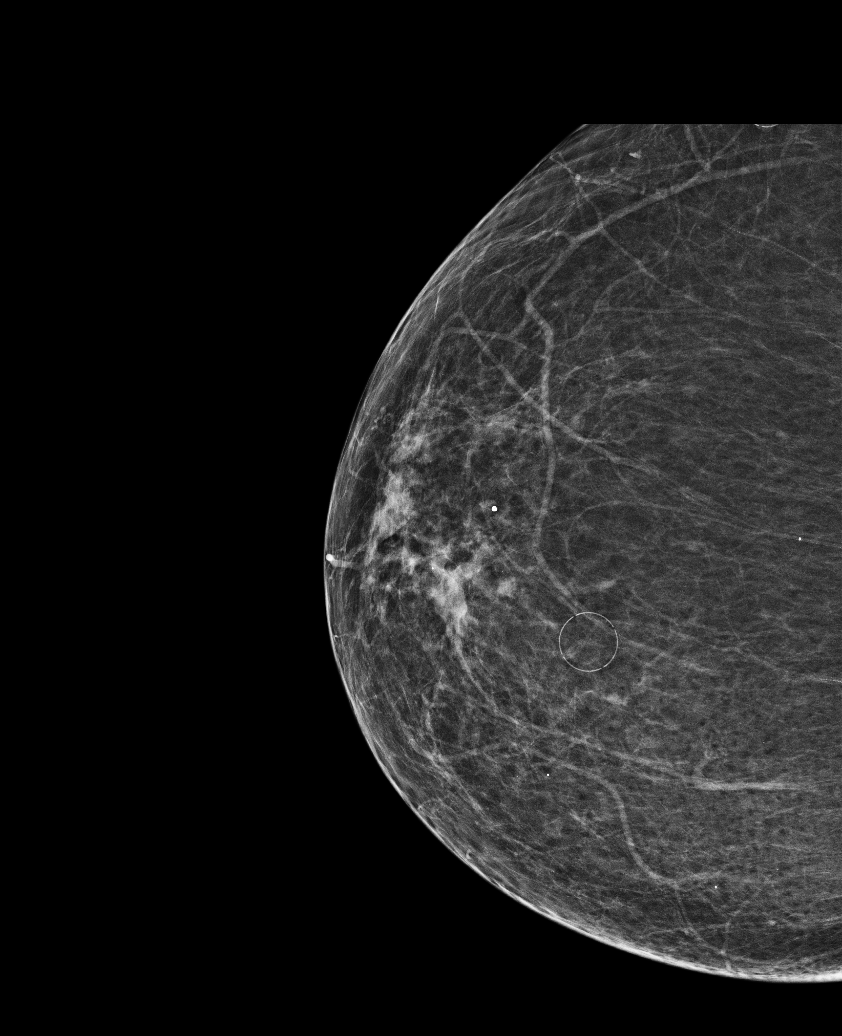

[6 of 26 positions shown; findings below may reference images not displayed]

IMPRESSION: There is no mammographic evidence of malignancy. A 1 year screening mammogram is recommended.
[facility]/T[facility]:07/23/2015 [DATE]
letter sent: Normal
Mammogram BI-RADS: 1 Negative   [id number]

## 2015-07-30 ENCOUNTER — Emergency Department
Admission: EM | Admit: 2015-07-30 | Discharge: 2015-07-30 | Disposition: A | Discharge status: Home / Self Care | Payer: Medicare Other | Attending: Emergency Medicine | Admitting: Emergency Medicine

## 2015-07-30 ENCOUNTER — Emergency Department: Payer: Medicare Other

## 2015-07-30 DIAGNOSIS — K5900 Constipation, unspecified: Secondary | ICD-10-CM | POA: Insufficient documentation

## 2015-07-30 DIAGNOSIS — E119 Type 2 diabetes mellitus without complications: Secondary | ICD-10-CM | POA: Insufficient documentation

## 2015-07-30 DIAGNOSIS — Z79899 Other long term (current) drug therapy: Secondary | ICD-10-CM | POA: Insufficient documentation

## 2015-07-30 DIAGNOSIS — I2510 Atherosclerotic heart disease of native coronary artery without angina pectoris: Secondary | ICD-10-CM | POA: Insufficient documentation

## 2015-07-30 DIAGNOSIS — E785 Hyperlipidemia, unspecified: Secondary | ICD-10-CM | POA: Insufficient documentation

## 2015-07-30 DIAGNOSIS — I10 Essential (primary) hypertension: Secondary | ICD-10-CM | POA: Insufficient documentation

## 2015-07-30 DIAGNOSIS — K644 Residual hemorrhoidal skin tags: Secondary | ICD-10-CM | POA: Insufficient documentation

## 2015-07-30 DIAGNOSIS — Z7984 Long term (current) use of oral hypoglycemic drugs: Secondary | ICD-10-CM | POA: Insufficient documentation

## 2015-07-30 DIAGNOSIS — E079 Disorder of thyroid, unspecified: Secondary | ICD-10-CM | POA: Insufficient documentation

## 2015-07-30 DIAGNOSIS — R1084 Generalized abdominal pain: Secondary | ICD-10-CM | POA: Insufficient documentation

## 2015-07-30 LAB — CBC AND DIFFERENTIAL
Basophils Absolute Automated: 0.03 10*3/uL (ref 0.00–0.20)
Basophils Automated: 0 %
Eosinophils Absolute Automated: 0.25 10*3/uL (ref 0.00–0.70)
Eosinophils Automated: 3 %
Hematocrit: 42.6 % (ref 37.0–47.0)
Hgb: 14 g/dL (ref 12.0–16.0)
Immature Granulocytes Absolute: 0.02 10*3/uL
Immature Granulocytes: 0 %
Lymphocytes Absolute Automated: 3.71 10*3/uL (ref 0.50–4.40)
Lymphocytes Automated: 48 %
MCH: 28.7 pg (ref 28.0–32.0)
MCHC: 32.9 g/dL (ref 32.0–36.0)
MCV: 87.5 fL (ref 80.0–100.0)
MPV: 12.2 fL (ref 9.4–12.3)
Monocytes Absolute Automated: 0.54 10*3/uL (ref 0.00–1.20)
Monocytes: 7 %
Neutrophils Absolute: 3.18 10*3/uL (ref 1.80–8.10)
Neutrophils: 41 %
Nucleated RBC: 0 /100 WBC (ref 0–1)
Platelets: 163 10*3/uL (ref 140–400)
RBC: 4.87 10*6/uL (ref 4.20–5.40)
RDW: 13 % (ref 12–15)
WBC: 7.73 10*3/uL (ref 3.50–10.80)

## 2015-07-30 LAB — COMPREHENSIVE METABOLIC PANEL
ALT: 46 U/L (ref 0–55)
AST (SGOT): 46 U/L — ABNORMAL HIGH (ref 5–34)
Albumin/Globulin Ratio: 1.1 (ref 0.9–2.2)
Albumin: 3.9 g/dL (ref 3.5–5.0)
Alkaline Phosphatase: 94 U/L (ref 37–106)
BUN: 14 mg/dL (ref 7.0–19.0)
Bilirubin, Total: 0.3 mg/dL (ref 0.2–1.2)
CO2: 25 mEq/L (ref 22–29)
Calcium: 9.5 mg/dL (ref 8.5–10.5)
Chloride: 99 mEq/L — ABNORMAL LOW (ref 100–111)
Creatinine: 0.8 mg/dL (ref 0.6–1.0)
Globulin: 3.4 g/dL (ref 2.0–3.6)
Glucose: 108 mg/dL — ABNORMAL HIGH (ref 70–100)
Potassium: 3.7 mEq/L (ref 3.5–5.1)
Protein, Total: 7.3 g/dL (ref 6.0–8.3)
Sodium: 136 mEq/L (ref 136–145)

## 2015-07-30 LAB — URINALYSIS, REFLEX TO MICROSCOPIC EXAM IF INDICATED
Bilirubin, UA: NEGATIVE
Blood, UA: NEGATIVE
Glucose, UA: NEGATIVE
Ketones UA: NEGATIVE
Leukocyte Esterase, UA: NEGATIVE
Nitrite, UA: NEGATIVE
Protein, UR: NEGATIVE
Specific Gravity UA: 1.007 (ref 1.001–1.035)
Urine pH: 7 (ref 5.0–8.0)
Urobilinogen, UA: NORMAL mg/dL

## 2015-07-30 LAB — GFR: EGFR: >60

## 2015-07-30 LAB — LIPASE: Lipase: 15 U/L (ref 8–78)

## 2015-07-30 MED ORDER — ALUM & MAG HYDROXIDE-SIMETH 200-200-20 MG/5ML PO SUSP
30.0000 mL | Freq: Once | ORAL | Status: AC
Start: 2015-07-30 — End: 2015-07-30
  Administered 2015-07-30: 30 mL via ORAL
  Filled 2015-07-30: qty 30

## 2015-07-30 MED ORDER — PRAMOXINE HCL 1 % RE FOAM
RECTAL | Status: DC | PRN
Start: 2015-07-30 — End: 2017-12-14

## 2015-07-30 MED ORDER — LIDOCAINE VISCOUS 2 % MT SOLN
10.0000 mL | Freq: Once | OROMUCOSAL | Status: AC
Start: 2015-07-30 — End: 2015-07-30
  Administered 2015-07-30: 10 mL via OROMUCOSAL
  Filled 2015-07-30: qty 15

## 2015-07-30 NOTE — ED Provider Notes (Signed)
Date Time: 07/30/2015 4:00 PM  Patient Name: Katelyn Cortez  Attending Physician: Maurine Minister, MD    Attending Note:   I have reviewed and agree with the history. The pertinent physical exam has been documented.    Selected historical findings: 69 y.o. female with h/o hemorrhoids p/w several months of abd pain and intermittent constipation. Pt was recently seen by her PMD with negative MRI. Last colonoscopy was > 15 yrs ago. +nausea  No f/c, vomiting, diarrhea, rectal bleeding.     Case discussed - I agree with plan of care - will refer to GI for colonoscopy as overdue.                                                I was acting as a Neurosurgeon for Maurine Minister, MD on Daisy Lazar    I am the first provider for this patient and I personally performed the services documented. Jacqulyn Cane is scribing for me on Mannina-GRANDE,Brittany C. This note and the patient instructions accurately reflect work and decisions made by me.  Maurine Minister, MD    Maurine Minister, MD  08/08/15 [phone]

## 2015-07-30 NOTE — ED Notes (Signed)
pt with back pain and abd pain and "ball" in anus when she wipes x 3 weeks. saw pmd had mri done, negative. denies rectal bleeding, h/o hemorrhoids. +nausea. intermittent constipation.

## 2015-07-30 NOTE — Discharge Instructions (Signed)
take daily stool softener, colace  Drink plenty of fluids       Hemorroides     Hemorrhoids     1.  Se le han diagnosticado hemorroides.   1.  You have been diagnosed with hemorrhoids.             2.  Las hemorroides son venas inflamadas que se encuentran alrededor del ano (recto). Las venas son vasos sanguneos que transportan sangre al [employer]. Los esfuerzos para defecar causan ms hemorroides. Las hemorroides se pueden presentar a Hospital doctor. Tambin pueden deberse a una complicacin de una enfermedad heptica. Las hemorroides pueden ser externas (fuera del recto) o internas (dentro del recto). Las hemorroides externas pueden sentirse como un bulto cerca del ano. Algunas hemorroides tienen cogulos en su interior; estas son muy dolorosas. Posiblemente vea sangre en su papel sanitario, en el sanitario o en sus heces (caca).   2.  Hemorrhoids are swollen veins around the anus (rectum). Veins are blood vessels that carry blood to the heart. Straining to move the bowels causes most hemorrhoids. Hemorrhoids often happen during pregnancy. They may also be a complication of liver disease. Hemorrhoids can be external (outside the rectum) or internal (inside the rectum). External hemorrhoids may feel like a lump near the anus. Some hemorrhoids have blood clots inside; these are very painful. You may see red blood on your toilet paper, in the toilet bowl or on your stool (poop).             3.  Con frecuencia, las hemorroides son tratadas con ablandadores de heces, con los cuales podr defecar ms fcilmente. Su mdico puede recomendarle una crema o un supositorio (un medicamento que se Personal assistant ano) para ayudarle con la hinchazn. Su mdico tambin puede recomendarle analgsicos. Use todos los medicamentos como se indica.   3.  Hemorrhoids are often treated with stool softeners, which make bowel movements easier. Your doctor may recommend a cream or suppository (medicine  put in the rectum) to help with swelling. Your doctor may also recommend pain medicine. Use all medicines as prescribed.             4.  Tome un "bao de asiento" caliente por al menos 15 minutos, 3-4 veces al da y despus de ir al bao para ayudarle con Chief Technology [employer]. Esto tambin le ayudar a Pharmacologist su ano limpio. La limpieza del rea es MUY IMPORTANTE para ayudar al proceso de sanacin de las hemorroides.   4.  Take a hot "sitz bath" for at least 15 minutes, 3-4 times a day and after each bowel movement to help the pain. It will also help to keep the anus clean. Keeping the area clean is VERY IMPORTANT to help the hemorrhoid heal.      * PARA TOMAR UN BAO DE ASIENTO: llene la baera con unas cuantas pulgadas de agua caliente. El agua NO DEBERA estar tan caliente al grado de causar incomodidad. [place] en el agua. Sacuda vigorosamente el agua contra su ano para limpiar cualquier resto de materia fecal que haya quedado en el rea. Esto tambin dar alivio a la piel irritada. Haga esto durante 15 minutos.    * TO TAKE A SITZ BATH: Fill the bath tub with a few inches of hot water. The water SHOULD NOT be so hot that it causes discomfort. Sit in the water. Briskly swish water against your anus to clean off any stool around the area. This will also soothe the  irritated skin. Do this for about 15 minutes.             5.  Incremente la cantidad de fibra en su dieta. Seleccione alimentos altos en fibra como frutas, verduras y panes hechos de grano entero. Su mdico tambin puede recomendarle un suplemento de [place].   5.  Increase the fiber or bulk in your diet. Choose foods high in fiber like fruits, vegetables and whole grain breads. Your doctor may also recommend a fiber supplement.      * No se siente en el bao por mucho tiempo. No se ponga a leer ni se relaje!    * Don't sit on the toilet too long. No reading or relaxing!      * No haga esfuerzos (pujar demasiado).    * Do not strain  (push too hard)             6.  Dele seguimiento como se le indic. Posiblemente sea referido a un cirujano para una evaluacin posterior de sus hemorroides.   6.  Follow up as directed. You may be referred to a surgeon for further evaluation of your hemorrhoids.             7.  DEBE BUSCAR ATENCIN MDICA INMEDIATAMENTE, AQU O EN LA SALA DE EMERGENCIAS MS CERCANA, SI SE PRESENTA CUALQUIERA DE LAS SIGUIENTES SITUACIONES:   7.  YOU SHOULD SEEK MEDICAL ATTENTION IMMEDIATELY, EITHER HERE OR AT THE NEAREST EMERGENCY DEPARTMENT, IF ANY OF THE FOLLOWING OCCURS:      * Si el dolor empeora repentinamente.    * The pain suddenly gets worse.      * Si tiene vmitos Programmer, multimedia) constantemente o vomita sangre o algo que parece "granos de caf".    * Repeated vomiting (throwing up), or vomiting blood or material that looks like coffee grounds.      * Si sus heces presentan sangre, estn muy oscuras o parecen alquitrn.    * Your stool has blood, gets very dark or looks like tar.                           Dolor abdominal     Abdominal Pain     1.  Se le ha diagnosticado dolor abdominal (de estmago). An se desconoce la causa de su dolor.    1.  You have been diagnosed with abdominal (belly) pain. The cause of your pain is not yet known.             2.  Hay muchas causas de dolor abdominal. stas van desde infecciones virales hasta espasmos intestinales. Puede que necesite otro examen peridico o ms pruebas para determinar la causa del dolor.   2.  Many things can cause abdominal pain. Examples include viral infections and bowel (intestine) spasms. You might need another examination or more tests to find out why you have pain.             3.  En este momento, parece ser que su dolor no es resultado de una condicin peligrosa. Usted no requiere de [place]. No necesita permanecer en el hospital.    3.  At this time, your pain does not seem to be caused by  anything dangerous. You do not need surgery. You do not need to stay in the hospital.              4.  Aunque creemos que su condicin  no es peligrosa por [address], es importante que tenga cuidado. A veces, un problema que parece leve puede convertirse en algo serio despus. Por lo [place], es muy importante que regrese aqu o se dirija a la [facility] ms cercana a menos que est 100% mejor.   4.  Though we don't believe your condition is dangerous right now, it is important to be careful. Sometimes a problem that seems mild can become serious later. This is why it is very important that you return here or go to the nearest Emergency Department unless you are 100% improved.             5.  DEBE BUSCAR ATENCIN MDICA INMEDIATA, AQU O EN LA SALA DE EMERGENCIAS MS CERCANA, SI SE PRESENTA CUALQUIERA DE LAS SIGUIENTES SITUACIONES:    5.  YOU SHOULD SEEK MEDICAL ATTENTION IMMEDIATELY, EITHER HERE OR AT THE NEAREST EMERGENCY DEPARTMENT, IF ANY OF THE FOLLOWING OCCURS:      * El dolor contina o [place].     * Your pain does not go away or gets worse.      * No puede retener lquidos en el estmago o su vmito es verde oscuro.     * You cannot keep fluids down or your vomit is dark green.       * Vomita sangre o hay sangre en sus heces. La sangre puede ser de color rojo brillante o rojo oscuro. Tambin puede ser negra o como alquitrn.    * You vomit blood or see blood in your stool. Blood might be bright red or dark red. It can also be black and look like tar.      * Tiene fiebre (temperatura mayor de 100.55F / 38C) o escalofros.    * You have a fever (temperature higher than 100.55F / 38C) or shaking chills.      * Su piel o sus ojos se ven amarillos o su orina tiene un color caf.     * Your skin or eyes look yellow or your urine looks brown.      * Tiene una fuerte diarrea.    * You have severe diarrhea.                 Constipation    You have been  diagnosed with constipation.    Constipation is one of the most common problems people have. It is especially common for those over age 72.    Constipation is caused by a few things. This includes not enough fiber in the diet. It also includes not enough regular exercise. Hypothyroidism is another possible cause. Medicines (especially narcotic pain medicines like oxycodone (Percocet), hydrocodone (Vicodin), propoxyphene (Darvocet), and codeine (Tylenol #3) can cause it. Rarely, constipation can be a symptom in colon cancer. You should follow up with your doctor.    Typical constipation symptoms are less bowel movements than usual and small hard stool. Sometimes, diarrhea happens when stool leaks over and around a blockage of hard stool. This is called "overflow incontinence."    There are many treatments for constipation. These include having more fiber in your diet. Fruits, vegetables and foods with bran are rich in fiber. One cup of bran cereal a day usually gets you the daily dietary fiber and fluids you need. A teaspoon (5 ml) of Metamucil with each meal usually works as well.    You may also need suppositories, enemas and/or pills for your constipation. Use these only after talking  about them with your doctor.    YOU SHOULD SEEK MEDICAL ATTENTION IMMEDIATELY, EITHER HERE OR AT THE NEAREST EMERGENCY DEPARTMENT, IF ANY OF THE FOLLOWING OCCURS:   Vomiting.   Fever (temperature higher than 100.69F / 38C).   Bloody or black, tarry stool.   Abdominal (belly) pain that is new, worse or that does not get better in the next 24 hours.   Cannot have a bowel movement even with laxatives, enemas and/or suppositories used as directed.   Worsening in any way or any concerns.

## 2015-07-30 NOTE — ED Provider Notes (Signed)
EMERGENCY DEPARTMENT HISTORY AND PHYSICAL EXAM    Date: 07/30/2015  Patient Name: Katelyn Cortez  Attending Physician: No att. providers found  Physician's Assistant: Pearlean Brownie ANN, PA      Disposition and Treatment Plan    Clinical Impression:   1. External hemorrhoids    2. Generalized abdominal pain    3. Constipation, unspecified constipation type         ED Disposition     Discharge Katelyn Cortez discharge to home/self care.    Condition at disposition: Stable               History of Presenting Illness     Chief Complaint   Patient presents with   . Abdominal Pain       Additional History: Katelyn Cortez is a 69 y.o. female pmh DM, cad, obesity, thyroid dz, pw several mos of lower ab pain, had normal mri and [place] and now has several days of painful/itchy rectal lump.  No assoc fc, nvd . Does admit to occ diff having bm's and now pain with bm's. No brbpr or melena. No wt  Or appetite changes. Last colonoscopy 15 yr+ had papsmear today, no concerns by gyn. Never had EGD        PCP: Orvis Brill, MD    No current facility-administered medications for this encounter.    Current outpatient prescriptions:   .  metFORMIN (GLUCOPHAGE) 850 MG tablet, Take 850 mg by mouth 2 (two) times daily with meals., Disp: , Rfl:   .  pravastatin (PRAVACHOL) 40 MG tablet, Take 40 mg by mouth daily., Disp: , Rfl:   .  traMADol-acetaminophen (ULTRACET) 37.5-325 MG per tablet, Take 1 tablet by mouth every 6 (six) hours as needed for Pain., Disp: , Rfl:   .  valsartan-hydrochlorothiazide (DIOVAN-HCT) 320-12.5 MG per tablet, Take 1 tablet by mouth daily., Disp: , Rfl:   .  levothyroxine (SYNTHROID, LEVOTHROID) 125 MCG tablet, Take 125 mcg by mouth daily., Disp: , Rfl:   .  pramoxine (ANUSOL, PROCTOFOAM) 1 % foam, Place rectally every 2 (two) hours as needed for Hemorrhoids., Disp: 15 g, Rfl: 0    Past Medical History     Past Medical History   Diagnosis Date   . Hypertensive disorder    . Diabetes  mellitus without complication    . Hyperlipidemia    . Arthritis    . Thyroid disease      History reviewed. No pertinent past surgical history.    Family History     History reviewed. No pertinent family history.    Social History     Social History     Social History   . Marital Status: Single     Spouse Name: N/A   . Number of Children: N/A   . Years of Education: N/A     Social History Main Topics   . Smoking status: Never Smoker    . Smokeless tobacco: Not on file   . Alcohol Use: No   . Drug Use: No   . Sexual Activity: Not on file     Other Topics Concern   . Not on file     Social History Narrative       Allergies     No Known Allergies    Review of Systems     Positive and negative ROS elements as per HPI.  All Other Systems Reviewed and Negative: Yes    Physical Exam   BP  188/80 mmHg  Pulse 79  Temp(Src) 98.6 F (37 C) (Oral)  Resp 16  SpO2 98%    Constitutional: Vital signs reviewed. Well appearing. In mild  Distress, AF, ra sats 95  Head: Normocephalic, atraumatic  Eyes: No conjunctival injection. No discharge.  ENT: Mucous membranes moist  Neck: Normal range of motion. Non-tender.  Respiratory/Chest: Clear to auscultation. No respiratory distress.   Cardiovascular: Regular rate and rhythm. No murmur.   Abdomen: Soft +epigastric and rlq ttp. No guarding. No masses or hepatosplenomegaly.  Rectal-+nonthrombosed external hemorrhoids laterally, no bleeding, normal rectal tone, no masses, nttp  UpperExtremity:no edema  LowerExtremity: No edema. No cyanosis.  Neurological: No focal motor deficits by observation. Speech normal. Memory normal.  Skin: Warm and dry. No rash.  Lymphatic: No cervical lymphadenopathy.  Psychiatric: Normal affect. Normal concentration.        Diagnostic Study Results   Labs -     Results     Procedure Component Value Units Date/Time    Comprehensive metabolic panel [[id number]]  (Abnormal) Collected:  07/30/15 1636    Specimen Information:  Blood Updated:  07/30/15 1701      Glucose 108 (H) mg/dL      BUN 40.9 mg/dL      Creatinine 0.8 mg/dL      Sodium 811 mEq/L      Potassium 3.7 mEq/L      Chloride 99 (L) mEq/L      CO2 25 mEq/L      Calcium 9.5 mg/dL      Protein, Total 7.3 g/dL      Albumin 3.9 g/dL      AST (SGOT) 46 (H) U/L      ALT 46 U/L      Alkaline Phosphatase 94 U/L      Bilirubin, Total 0.3 mg/dL      Globulin 3.4 g/dL      Albumin/Globulin Ratio 1.1     Lipase [[id number]] Collected:  07/30/15 1636    Specimen Information:  Blood Updated:  07/30/15 1701     Lipase 15 U/L     GFR [[id number]] Collected:  07/30/15 1636     EGFR >60.0 Updated:  07/30/15 1701    CBC with differential [[id number]] Collected:  07/30/15 1636    Specimen Information:  Blood from Blood Updated:  07/30/15 1652     WBC 7.73 x10 3/uL      Hgb 14.0 g/dL      Hematocrit 41.3 %      Platelets 163 x10 3/uL      RBC 4.87 x10 6/uL      MCV 87.5 fL      MCH 28.7 pg      MCHC 32.9 g/dL      RDW 13 %      MPV 12.2 fL      Neutrophils 41 %      Lymphocytes Automated 48 %      Monocytes 7 %      Eosinophils Automated 3 %      Basophils Automated 0 %      Immature Granulocyte 0 %      Nucleated RBC 0 /100 WBC      Neutrophils Absolute 3.18 x10 3/uL      Abs Lymph Automated 3.71 x10 3/uL      Abs Mono Automated 0.54 x10 3/uL      Abs Eos Automated 0.25 x10 3/uL      Absolute Baso Automated 0.03 x10 3/uL  Absolute Immature Granulocyte 0.02 x10 3/uL     UA, Reflex to Microscopic (pts  3 + yrs) [[id number]] Collected:  07/30/15 1636    Specimen Information:  Urine Updated:  07/30/15 1649     Urine Type Clean Catch      Color, UA Yellow      Clarity, UA Clear      Specific Gravity UA 1.007      Urine pH 7.0      Leukocyte Esterase, UA Negative      Nitrite, UA Negative      Protein, UR Negative      Glucose, UA Negative      Ketones UA Negative      Urobilinogen, UA Normal mg/dL      Bilirubin, UA Negative      Blood, UA Negative         Radiologic Studies -   Radiology Results (24 Hour)     Procedure Component Value  Units Date/Time    Abdomen Portable [[id number]] Collected:  07/30/15 1548    Order Status:  Completed Updated:  07/30/15 1556    Narrative:      CLINICAL HISTORY: Constipation.    COMPARISON: None.    FINDINGS: Single supine view the abdomen was exposed portably. There is  a moderate amount of retained stool in the colon. The bowel gas pattern  appears nonobstructive. No free intraperitoneal air is identified. No  abnormal calcifications are seen. The visualized bones are unremarkable.      Impression:       Nonobstructive bowel gas pattern. Moderate retained stool.Colonel Bald, MD   07/30/2015 3:52 PM        .    EKG Interpretation: N/A    Clinical Course in the Emergency Department   Labs normal, pt more comfortable after gi cocktail will [place] as planned rx proctofoam, otc pepcid, colace, miralax,   Return for incr pain, fever, vomiting, melena      Medical Decision Making     I am the first provider for this patient.  I reviewed the vital signs, nursing notes, past medical history, past surgical history, family history and social history.      Vital Signs -   Patient Vitals for the past 12 hrs:   BP Temp Pulse Resp   07/30/15 1800 188/80 mmHg - 79 16   07/30/15 1526 200/87 mmHg 98.6 F (37 C) 79 16   07/30/15 1522 - - 81 -       Hx/PE  ddx-ab pain, rectal lump-hemorrhoids, mass, gerd, constipation,   dw EMD,[place]  Reviewed vitals, nursing notes  Plan-gi cocktail, labs, ua, reassess , most likely [place] to fu EGD/colonoscopy, bowel regimen  Reviewed labs-yes  Reviewed imaging-na  Re-eval-              _______________________________     Attestations      .  ______________________________              Simon Rhein, PA  07/30/15 [phone]

## 2015-09-04 ENCOUNTER — Ambulatory Visit: Payer: Medicare Other

## 2015-09-04 NOTE — Pre-Procedure Instructions (Addendum)
   No testing ordered by surgeon.   No testing per anesthesia guidelines.   Patient referred to office of Dr. Sherryll Burger regarding specific diet and bowel prep instructions - contact number provided

## 2015-09-09 ENCOUNTER — Ambulatory Visit: Payer: Medicare Other | Admitting: Gastroenterology

## 2015-09-09 ENCOUNTER — Ambulatory Visit
Admission: RE | Admit: 2015-09-09 | Discharge: 2015-09-09 | Disposition: A | Discharge status: Home / Self Care | Payer: Medicare Other | Source: Ambulatory Visit | Attending: Gastroenterology | Admitting: Gastroenterology

## 2015-09-09 ENCOUNTER — Ambulatory Visit: Payer: Self-pay

## 2015-09-09 ENCOUNTER — Ambulatory Visit: Payer: Medicare Other | Admitting: Registered Nurse

## 2015-09-09 ENCOUNTER — Encounter
Admission: RE | Disposition: A | Discharge status: Home / Self Care | Payer: Self-pay | Source: Ambulatory Visit | Attending: Gastroenterology

## 2015-09-09 DIAGNOSIS — K648 Other hemorrhoids: Secondary | ICD-10-CM | POA: Insufficient documentation

## 2015-09-09 DIAGNOSIS — D123 Benign neoplasm of transverse colon: Secondary | ICD-10-CM | POA: Insufficient documentation

## 2015-09-09 DIAGNOSIS — R1013 Epigastric pain: Secondary | ICD-10-CM | POA: Insufficient documentation

## 2015-09-09 DIAGNOSIS — Z7984 Long term (current) use of oral hypoglycemic drugs: Secondary | ICD-10-CM | POA: Insufficient documentation

## 2015-09-09 DIAGNOSIS — K449 Diaphragmatic hernia without obstruction or gangrene: Secondary | ICD-10-CM | POA: Insufficient documentation

## 2015-09-09 DIAGNOSIS — K635 Polyp of colon: Secondary | ICD-10-CM

## 2015-09-09 DIAGNOSIS — K5730 Diverticulosis of large intestine without perforation or abscess without bleeding: Secondary | ICD-10-CM | POA: Insufficient documentation

## 2015-09-09 DIAGNOSIS — K219 Gastro-esophageal reflux disease without esophagitis: Secondary | ICD-10-CM

## 2015-09-09 DIAGNOSIS — E785 Hyperlipidemia, unspecified: Secondary | ICD-10-CM | POA: Insufficient documentation

## 2015-09-09 DIAGNOSIS — E039 Hypothyroidism, unspecified: Secondary | ICD-10-CM | POA: Insufficient documentation

## 2015-09-09 DIAGNOSIS — I10 Essential (primary) hypertension: Secondary | ICD-10-CM | POA: Insufficient documentation

## 2015-09-09 DIAGNOSIS — Z1211 Encounter for screening for malignant neoplasm of colon: Secondary | ICD-10-CM | POA: Insufficient documentation

## 2015-09-09 DIAGNOSIS — E119 Type 2 diabetes mellitus without complications: Secondary | ICD-10-CM | POA: Insufficient documentation

## 2015-09-09 DIAGNOSIS — R011 Cardiac murmur, unspecified: Secondary | ICD-10-CM | POA: Insufficient documentation

## 2015-09-09 HISTORY — PX: EGD, COLONOSCOPY: SHX3799

## 2015-09-09 HISTORY — DX: Hypothyroidism, unspecified: E03.9

## 2015-09-09 HISTORY — DX: Cardiac murmur, unspecified: R01.1

## 2015-09-09 HISTORY — DX: Type 2 diabetes mellitus without complications: E11.9

## 2015-09-09 LAB — GLUCOSE WHOLE BLOOD - POCT: Whole Blood Glucose POCT: 109 mg/dL — ABNORMAL HIGH (ref 70–100)

## 2015-09-09 SURGERY — EGD, COLONOSCOPY
Anesthesia: Anesthesia General

## 2015-09-09 MED ORDER — PROPOFOL 10 MG/ML IV EMUL (WRAP)
INTRAVENOUS | Status: AC
Start: 2015-09-09 — End: ?
  Filled 2015-09-09: qty 20

## 2015-09-09 MED ORDER — PROPOFOL 10 MG/ML IV EMUL (WRAP)
INTRAVENOUS | Status: AC
Start: 2015-09-09 — End: ?
  Filled 2015-09-09: qty 50

## 2015-09-09 MED ORDER — PROPOFOL 10 MG/ML IV EMUL (WRAP)
INTRAVENOUS | Status: DC | PRN
Start: 2015-09-09 — End: 2015-09-09
  Administered 2015-09-09: 100 mg via INTRAVENOUS

## 2015-09-09 MED ORDER — LIDOCAINE HCL (PF) 2 % IJ SOLN
INTRAMUSCULAR | Status: AC
Start: 2015-09-09 — End: ?
  Filled 2015-09-09: qty 5

## 2015-09-09 MED ORDER — LACTATED RINGERS IV SOLN
INTRAVENOUS | Status: DC
Start: 2015-09-09 — End: 2015-09-09

## 2015-09-09 MED ORDER — PROPOFOL INFUSION 10 MG/ML
INTRAVENOUS | Status: DC | PRN
Start: 2015-09-09 — End: 2015-09-09
  Administered 2015-09-09: 160 ug/kg/min via INTRAVENOUS

## 2015-09-09 MED ORDER — LIDOCAINE HCL 2 % IJ SOLN
INTRAMUSCULAR | Status: DC | PRN
Start: 2015-09-09 — End: 2015-09-09
  Administered 2015-09-09: 50 mg

## 2015-09-09 SURGICAL SUPPLY — 18 items
CNTNR SPEC W LLDPE 16OZ LEK SHTR RST (Procedure Accessories) ×1
CONTAINER HISTOLOGY 60 ML 30 ML GRADUATE LEAK RESISTANT O RING PREFILL (Procedure Accessories) IMPLANT
CONTAINER SPEC LLDPE 16OZ W LF NS LEK (Procedure Accessories) ×1 IMPLANT
CONTAINER SPECIMEN C16 OZ WIDE LEAK SHATTER RESISTANT SNAP ON LID (Procedure Accessories) ×1 IMPLANT
FORCEP BIOPSY 240CM RADIAL JA (Instrument) ×2 IMPLANT
FORCEP BIOPSY 240CM RADIALJAW (Instrument) ×2 IMPLANT
GLOVE NITRILE PREMIERPRO MED (Glove) ×2 IMPLANT
JELLY KY LUBRICATNG 2 OZ FLIP (Procedure Accessories) ×2 IMPLANT
PAD ELECTROSRG GRND REM W CRD (Procedure Accessories) ×2 IMPLANT
SNARE CAPTIFLEX OVAL 27MM (Instrument) ×1 IMPLANT
SOL FORMALIN 10% PREFILL 30ML (Procedure Accessories) ×2 IMPLANT
SYRINGE 50 ML GRADUATE NONPYROGENIC DEHP (Syringes, Needles) ×1 IMPLANT
SYRINGE 50 ML GRADUATE NONPYROGENIC DEHP FREE PVC FREE BD MEDICAL (Syringes, Needles) ×1 IMPLANT
SYRINGE MED 50ML LF STRL GRAD N-PYRG (Syringes, Needles) ×2
TUBING CONNECTING STERILE 10FT (Tubing) ×1
TUBING SCT PVC ARG 3/16IN 10FT LF STRL (Tubing) ×1
TUBING SUCTION ID3/16 IN L10 FT (Tubing) ×1 IMPLANT
TUBING SUCTION ID3/16 IN L10 FT NONCONDUCTIVE STRAIGHT MALE FEMALE (Tubing) ×1 IMPLANT

## 2015-09-09 NOTE — Discharge Instructions (Signed)
INSTRUCCIONES DE SALIDA PARA PACIENTES DE ENDOSCOPIA  Instrucciones generales:  1. Cuando un paciente ha recibido sedantes, su juicio, percepciones y coordinacin  se ven bastante disminuidos. Aunque se siente despierto y Control and instrumentation engineer, la ley lo considera intoxicado. Por lo tanto, durante las proximas 24 horas o hasta la maana siguiente, debera acatar las instructiones explicadas a continuacion:  *No conduzca vehiculos  *No utilice aparatos ni equipos que exijan un tiempo de reaccin (por ejemplo, estufas, cocina, herramientas elctricas, maquinaria)  *No firme documentos legales ni tome decisiones importantes.  *No fume si se encuentra solo  *No consuma bebidas alcohlicas  *Vaya directamente a su casa y descanse varias horas antes de reanudar sus actividades normales.  *Se recomienda fuertemente que permanezca un adulto responsable con usted durante las prximas 24 horas    2. En ocasiones se experimenta sensibilidad, inflamacin o dolor en el sitio en que se administro el sedante por via intravenosa. Si  es su caso, apliquese calor humedo en el sitio afectado. Notifique a su mdico si los sintomas persisten.    Instrucciones especficas - Informe al mdico si experimenta algundode los siguientes sintomas:    Endoscopia Superior  1. Dolor en el pecho  2. Nusea/vmitos  3. Fiebre/escalofros dentro de las 24 horas posteriores a la intervencin; Marketing executive superior a los 101deg F  4. Hinchazn o dolores abdominales fuertes o persistentes    Ademas:  En ciertos casos este tipo de intervencion puede dejar al paciente con la garganta algo adolorida. Normalmente Environmental health practitioner se puede aliviar con grgaras de agua tibia o caramelos para el dolor de garganta, o bebidas fras y paletas heladas.    La colonoscopia  1. Hinchazn o dolores abdominal fuertes o persistentes que no disminuyen en un plazo de 2-3 horas  2. Sangrado rectal abundante (normalmente puede observarse algo de sangre con moco, en particular si se realizo un  biopsia o polipectomia o si el paciente tiene hemorroides) .  3. Nuseas/vmitos  4. Fiebre/escalofros dentro de las 24 horas posteriores a la intervencin; Marketing executive superior a los 101deg F    Ademas:  Si se le ha extirpado un plipo. NO cosuma aspirina ni productos que le contengan (por ejemplo Anacin, Alka Seltzer, Bufferin, etc.) ni frmacos antiinflamatorios no esteroideos  (Por ejemplo, Advil, Motrin, etc.) durante siete das a menos que su mdico le indique lo contrario. Puede tomar Tylenol normal o Tylenol Extra Strength (extra fuerte). Abstengase de viajar por siete dias.    Instrucciones de alta adicionales  Su dieta despus del procedimiento: Comience con algo ligero (pan tostado, gelatina, sopa, etc.), entonces en Reanudar para regular la dieta segn la tolerancia. Nada picante, grasosos o fritos hoy. NO Lquidos rojos, alimentos o salsas para 24 HORAS      Gastritis [Gastritis, Adult]    La GASTRITIS es una irritacin del recubrimiento del estmago. Puede ser H[place]on Foley (reciente) o crnica (de largo plazo). La puede causar el consumo excesivo de alcohol o medicamentos antiinflamatorios (como la aspirina, el ibuprofeno ola prednisona).  La gastritis suele provocar un ardor doloroso (quemazn) en la parte superior del estmago. Otros sntomas incluyen nuseas, vmito, prdida del apetito, y eruptos o sensacin de inflamiento (abotagamiento). La sangre en el vmito o las heces (de color rojizo o negruzco) es una seal de sangrado en el [place]. Esto requiere atencin mdica inmediata.  Las pruebas de deteccin del H. pylori se usan para comprobar si hay una infeccin bacteriana. Si no se encuentra una infeccin, la gastritis puede tratarse suspendiendo la causa  y tomando anticidos ms un bloqueador de cido. Si se encuentra la infeccin por el H. pylori, tambin se recetarn antibiticos. A las personas de 55 aos o ms se les podra hacer otras pruebas antes de Programmer, systems.  Se utilizan dos  pruebas comunes para International Paper sntomas. Neomia Dear serie gastrointestinal superior que consiste en una radiografa que se toma despus de haber bebido un lquido de consistencia similar a la tiza (giz) llamado bario. Este recubre al Teachers Insurance and Annuity Association y permite que el mdico vea en la radiografa si existe algn problema. Otra prueba se llama endoscopia, en la que un tubo delgado llamado endoscopio es introducidopor la boca y la garganta hasta el estmago para Engineer, manufacturing la causa de los sntomas.  Cuidados En PepsiCo:   Tome todo el ciclo del medicamento bloqueador de cido aunque antes comience a sentirse mejor. Este medicamento puede tardar Principal Financial para controlar por Levi Strauss sntomas. Si no puede pagar el medicamento recetado, pruebe uno de los bloqueadores de cido de venta Dorothy, tales como Pepcid AC, Tagamet, Zantac o Aciphex. Si estos no alivian sus sntomas, debe probarseun bloqueador de cido ms fuerte, tal como Prilosec OTC.   Si le han recetado antibiticos para tratar la infeccin por H. pylori, termine el ciclo completo del medicamento.Hgalo aunque se est sintiendo mejor. Si usted suspende el Praxair, la infeccin puede regresar y ser ms difcil de Warehouse manager.   Para controlar el dolor, puede usar anticidos, talescomo Tums, Rolaids, Mylanta o Maalox.Estos pueden ser tiles los primeros das despus de iniciar los bloqueadores de cido, cuando an no han comenzado asurtir Engineer, mining. Siga las instrucciones de Scientific laboratory technician. Los anticidos lquidos pueden funcionar mejor que los de [place].Tenga en cuenta que los anticidospueden interferircon la absorcin de ciertos medicamentos. Especficamente, no tome Tagamet (cimetidina), Zantac (ranitidina) o Carafate (sucralfato) antes de 1 hora de haber tomado un anticido. Consulte con su farmacutico si tiene alguna inquietud.   Los sntomas de la gastritis pueden empeorar si se comen ciertos alimentos. Evite las [address], fritas y  [place] condimentadas, el caf, el chocolate, la menta y las comidas con alto contenido de cido: tomates, ctricos (naranja, limn, toronja [pomelo]).   Evite el alcohol, la cafena y el tabaco que pueden retrasar la [place].   Evite laaspirina olos medicamentos antiinflamatorios tales como ibuprofeno [place] o Motrin] y naproxeno [Naprosyn o Aleve]. Puede usar acetaminofn [Tylenol] sin problemas. No tome ms de la cantidad que se recomienda en la etiqueta.  Programe una VISITA DE CONTROL con su mdico o segn le indique nuestro personal. Podran necesitarse otras pruebas. Si no empieza a sentirse [employer] prximos 4 das, comunquese con su mdico. Si le hicieron una radiografa, una tomografa computarizada (CT scan) o un electroencefalograma (ECG), este ser revisado por un especialista. Se le notificar si se encuentra algo nuevo que afecte la atencin que recibe.  Busque Prontamente Atencin Mdica  si algo de lo siguiente ocurre:   El dolor de [place] o se transfiere al lado derecho inferior del abdomen (zona del apndice).   Se presenta dolor en el pecho, o si el dolor empeora o se extiende a la espalda, el cuello, el hombro o el brazo.   Vmitofrecuente (no puede retener lquidos en Investment banker, corporate).   Sangre en la materia fecal o el vmito (de color negruzco o rojizo).   Se siente dbil o mareado, se desmay o tiene dificultades para respirar.   Fiebre de 100.19F (38C) o ms alta, o  como le haya indicado su proveedor de Psychologist, prison and probation services.   [address] [employer] [address], [place], [place] [address]. Todos los derechos reservados. Esta informacin no pretende sustituir la atencin Actor. Slo su mdico puede diagnosticar y tratar un problema de salud.      Qu es una hernia de hiato  Este problema comn ocurre cuando una porcin del estmago se desplaza hacia arriba a travs del diafragma a la cavidad del pecho. Las hernias del hiato no son como las hernias  en la ingle. La mayora de hernias de hiato no causan sntomas y no requieren tratamiento. Si se notan sntomas, normalmente pueden controlarse fcilmente.    Qu podra sentir  Afl[employer] Incorporated de las personas no presentan sntomas, pero si se produce reflujo (flujo del cido del Curator) podra notar lo siguiente:   Ardor u otras molestias en el pecho   Eructos frecuentes   Sabor cido en la boca   Problemas al tragar   Henry Schein de asfixia, tos o respiracin dificultosa por la noche      Tratamiento de los sntomas  Si se le diagnostic con una hernia hiatal, estas sugerencias pueden ayudarle a mejorar los sntomas:   Pierda el exceso de [place]. El exceso de peso ejerce presin en el estmago y en el esfago.   Evite fumar, el alcohol, las comidas grasosas, el chocolate, el caf, las menta y Animator.   Evite todo lo que le cause sntomas. Evite los alimentos y bebidas que le causen sntomas. Muchos medicamentos pueden causarlos tambin. Hable de sus medicamentos con su proveedor de atencin mdica.   Pruebe los medicamentos para reducir el cido. Los anticidos que se venden sin receta pueden ser tiles para Acupuncturist ardor de [place]. Hable con su proveedor de atencin [employer] con o sin receta que podran tambin serle tiles.   Normalmente, la [place] es slo necesaria cuando los sntomas son graves o cuando otras opciones no han ayudado a Investment banker, corporate. Su proveedor de Engineer, civil (consulting) a Chief Strategy Officer si la ciruga es una opcin para usted.   [address] [employer] [employer] [address], [place], [place] [address]. Todos los derechos reservados. Esta informacin no pretende sustituir la atencin Actor. Slo su mdico puede diagnosticar y tratar un problema de salud.      Los plipos del colon y el recto  El colon(denominado tambin "intestino grueso") es un conducto muscular de 4 a 6 pies (120 a 180 cm) de longitud que  constituye la ltima parte del aparato digestivo, y tiene la funcin de Environmental health practitioner agua y Academic librarian los desechos de los alimentos. El recto es la porcin terminal del colon, y mide unas 6 pulgadas (15 cm). El colon y el recto tienen un revestimiento interno liso que est compuesto de millones de clulas. Los cambios que experimentan estas clulas pueden producir unas masas en el colon que tienen el potencial de volverse cancerosas, y deben extirparse (sacarse).   Cuando cambia el revestimiento interno del colon  Los cambios que se producen en las clulas que revisten el colon o el recto pueden dar lugar a unas masas denominadas pliposque pueden volverse cancerosas con el paso de [place]. Extirpar los plipos temprano puede impedir que llegue a Corporate treasurer.     Plipos  Lospliposson masas carnosas de tejido que se forman en el revestimiento interno del colon o el recto.Los plipos pequeos suelen ser benignos (es  decir, no cancerosos). Sin embargo, con Amgen Inc clulas de un plipo pueden transformarse y Charity fundraiser. Ciertos tipos de plipos (denominados adenomatosos) son premalignos. El riesgo de cncer invasivo aumenta segn el tamao del plipo y ciertas caractersticas celulares y genticas. Esto significa que se pueden volver cancerosos si se dejan sin extirpar. Los plipos hiperplsicos son benignos, ya que pueden agrandarse bastante sin tornarse cancerosos.   Cncer  Casi todos los tumores cancerosos del colon y el recto comienzan cuando las clulas de un plipo empiezan a crecer de forma anormal.A medida que [place] creciendo, el tumor canceroso puede afectar a una porcin cada vez mayor del colon o el recto. Con el tiempo, el cncer tambin puede extenderse ms all del colon o el recto, e invadir los rganos cercanos o unas glndulas llamadas "ganglios linfticos". Las clulas tambin pueden propagarse a otras partes del cuerpo, en un proceso denominado "metstasis". Cuanto ms pronto  se extirpe un tumor canceroso, mejores sern las probabilidades de evitar que se disemine.   [address] [employer] [employer] [address], [place], [place] [address]. Todos los derechos reservados. Esta informacin no pretende sustituir la atencin Actor. Slo su mdico puede diagnosticar y tratar un problema de salud.      Hemorroides,Externas [Hemorrhoids, External]    Las hemorroides se producen por la hinchazn localizada de las venas que se encuentran alrededor del recto. A menudo aparecen por hacer repetidamente mucho esfuerzo al evacuar el intestino, o por levantar cargas pesadas. Tambin pueden aparecer Energy Transfer Partners ltimos meses del [place]. Las hemorroides se sienten como un bulto blando. Es posible que presenten comezn de Retail buyer. Cuando estn inflamadas, se endurecen y se vuelven muy dolorosas.  Cuidados En La Casa:   BAO DE ASIENTO: Sintese en una tina llena con unas 6 pulgadas (15 cm) de agua caliente. Deje que el agua corra para que se mantenga caliente durante un total de 10 a 15 minutos. Repita este procedimiento tres veces al da [employer] se haya aliviado el dolor.   La materia fecal debe estar siempre blanda a fin de no tener que hacer esfuerzos para Scientist, water quality intestino. A menos que le hayan recetado otro medicamento, intente lo siguiente:  SI EST ESTREIDO: Puede usar laxantes (laxatives) de venta sin receta, tal como la LECHE DE MAGNESIA (milk of magnesia) [de accin suave] o DULCOLAX (si necesita un medicamento de accin ms fuerte).  SI NO EST ESTREIDO (constipated), pero la materia fecal est endurecida, puede tomar Colace (docusato de sodio [sodium docusate]), que es un ablandador de la materia fecal. Este medicamento ablanda la materia fecal sin producir diarrea (diarrhea). Tambin le ser de ayuda beber ms cantidad de lquidos.   Puede aplicarse cremas directamente sobre las hemorroides, tales como ANUSOL o PREPARATION H, para ayudar a Pharmacologist y Biomedical engineer, y para acelerar el proceso de sanacin.  Prevencin:  Mantenga la materia fecal blanda para Surveyor, quantity esfuerzos al evacuar el intestino. Aumentar la FIBRA en su dieta (frutas, cereales, verduras y granos) Hotel manager a Producer, television/film/video el intestino de manera saludable. Si esto no Retail buyer, puede usar METAMUCIL o algn producto similar. Este producto es un suplemento de fibra (fiber supplement) que puede comprar sin receta. Debe beber lquidos adicionales cuando tome este medicamento para evitar el estreimiento (constipation).  Programe una VISITA DE CONTROL con su mdico si no comienza a responder en los prximos das al tratamiento antes descrito.  Busque Prontamente Atencin Mdica  si  algo de lo siguiente ocurre:   Abundante sangrado rectal (ms de una taza de sangre en 24 horas).   Dolor en el recto que [place] en aumento o dolor en el recto que contina durante ms de tres [address]  [address] de [place].   Debilidad, mareo o desmayo.   Sangre en el vmito (de color negruzco o rojizo).   [address] [employer] [employer] [address], [place], [place] [address]. Todos los derechos reservados. Esta informacin no pretende sustituir la atencin Actor. Slo su mdico puede diagnosticar y tratar un problema de salud.      Diverticulosis    Usted tiene diverticulosis. Esto quiere decir que se han formado pequeas bolsas en la pared del colon (en el intestino grueso). Este problema no suele causar sntomas, pero a veces las bolsas en el colon pueden infectarse o inflamarse. Cuando ocurre esto, el trastorno se llama diverticulitis.  El mdico le explicar como manejar este problema. Ciertos cambios en la dieta pueden ser suficientes para ayudar a Scientist, physiological diverticulosis y evitar que progrese a diverticulitis. Si desarrolla diverticulitis, es probable que necesite otros tratamientos.  Cuidados En PepsiCo  Medicamentos: Podran indicarle que tome diariamente suplementos de [place]. La fibra  aumenta el volumen de las heces, con lo cual estas se desplazan ms fcilmente por el colon. Tambin es posible que le recomienden un ablandador fecal y que le den medicamentos contra Chief Technology Officer. Asegrese de tomar todos sus medicamentos segn las indicaciones.   Coma alimentos no procesados con [address] contenido de [place], como pan o cereales de grano integral, frutas y verduras.   Beba 6 a 8 vasos de agua al [employer]H. [employer] Worldwide.   Observe si hay cambios en sus defecaciones e informe al mdico de estos cambios.   Comience un programa de ejercicio. Pregntele a su mdico cul es la mejor manera de Corporate investment banker.   Descanse y duerma lo suficiente.  Haga una VISITA DE CONTROL segn le indique el mdico o el personal del centro. Es posible que deba hacer visitas regulares al mdico para que revise su salud. Asegrese de no faltar a ninguna de las citas programadas.  Obtenga Atencin Mdica Inmediata  en cualquiera de los siguientes casos:   Fiebre de 100.51F (38C) o superior, o segn le indique su proveedor de atencin mdica   Clicos fuertes en la zona inferior izquierda del abdomen o dolor que empeora progresivamente   Sensibilidad en la zona inferior izquierda del abdomen o empeoramiento del dolor por todo el abdomen   Diarrea o estreimiento que no mejoran despus de 24 horas   Nuseas y vmito   [employer] Incorporated recto   2000-2015 [employer] [place]. [address], [place], [place] [address]. Todos los derechos reservados. Esta informacin no pretende sustituir la atencin Actor. Slo su mdico puede diagnosticar y tratar un problema de salud.      Una dieta con alto contenido de fibra  La fibra es el tejido que da consistencia y Holiday representative a las plantas. La mayora de los cereales, frijoles, verduras y frutas contienen [place]. Las comidas ricas en fibra llenan ms y tienen menos caloras y [place]the [place] 60 deg [place]). Tambin pueden reducir Lexmark International de ciertos trastornos de [place]. Para saber la cantidad de fibra de los alimentos en  lata, empacados o congelados, lea la etiqueta titulada "Nutrition Facts" donde se indicar la cantidad por porcin.    Beneficios de los d istintos tipos de [employer] tipos de fibra: las insolubles y las solubles.  Ambas facilitan la digestin y [place] a Art gallery manager.  Fibra insoluble. Este tipo de fibra se Occupational psychologist en cereales integrales y Materials engineer frutas y verduras (como la piel de la [place], [place] maz y las zanahorias). La fibra insoluble puede prevenir el estreimiento y reducir ciertos tipos de cncer.  Fibra soluble. Este tipo de fibra se encuentra en la avena, en los frijoles y en ciertas frutas y verduras (como fresas y arvejas). La fibra soluble puede reducir [employer] de colesterol (lo cual puede ayudar a prevenir el riesgo de trastornos cardacos) y [place] a Geophysical data processor de Banker.  Escoja alimentos ricos en [employer] Motor [employer] y panes de grano integral. Trate de comer6-8 onzas al Futures trader. Incluya trigo y cereales con salvado de avena, bollos o tostadas de pan integral y tortillas de maz en sus comidas.  Nils Pyle. Trate de comer2 tazas al da. [address] [place], [place], fresas, peras y bananas son buenas fuentes de [place]. (Nota: Los jugos generalmente no contienen [place].)  [place]. Trate de comer3 tazas al da. Aada esprragos, zanahorias, brcoli, arvejas y maz.  Legumbres (frijoles, lentejas y garbanzos).Una taza de lentejas cocidasproporcionan ms de 15 gramos de fibra. Tambin pruebe las habas.  Semillas. Un puado de semillas (de girasol, por ejemplo) le da aproximadamente 3 g de [place].  Lleve cuenta de la fibra que toma  [place] dieta sana debe incluir al menos 31 gramos de fibra si usted est en una dieta de 2,000 caloras al C.H. [employer] Worldwide. Aprenda a llevar cuenta de la cantidad de fibra que come Exxon Mobil Corporation. Comience por leer las etiquetas de nutricin de los alimentos y compre una variedad de ellos con alto contenido de [place]. Consulte con el mdico para saber si debe tomar suplementos de  fibra.   [address] [employer] [employer] [address] [place], [place] [address]. Todos los derechos reservados. Esta informacin no pretende sustituir la atencin Actor. Slo su mdico puede diagnosticar y tratar un problema de salud.

## 2015-09-09 NOTE — Anesthesia Postprocedure Evaluation (Signed)
Anesthesia Post Evaluation    Patient: Katelyn Cortez    Procedure(s) with comments:  EGD, COLONOSCOPY - EGD, COLONOSCOPY W/IVA    Anesthesia type: general    Last Vitals:   Filed Vitals:    09/09/15 1202   BP: 107/65   Pulse: 79   Temp:    Resp: 16   SpO2: 97%       Patient Location: Phase II PACU      Post Pain: Patient not complaining of pain, continue current therapy    Mental Status: awake and alert    Respiratory Function: tolerating room air    Cardiovascular: stable    Nausea/Vomiting: patient not complaining of nausea or vomiting    Hydration Status: adequate    Post Assessment: no apparent anesthetic complications, no evidence of recall and no reportable events          Anesthesia Qualified Clinical Data Registry    Central Line      CVC insertion : NO                                               Perioperative temperature management      General/neuraxial anesthesia > or = 60 minutes (excluding CABG) : NO                                Administration of antibiotic prophylaxis      Age > or = 18, with IV access, with surgical procedure for which antibiotic prophylaxis indicated, and not on chronic antibiotics : NO                  Medication Administration      Ordering or administration of drug inconsistent with intended drug, dose, delivery or timing : NO      Dental loss/damage      Dental injury with administration of anesthesia : NO      Difficult intubation due to unrecognized difficult airway        Elective airway procedure including but not limited to: tracheostomy, fiberoptic bronchoscopy, rigid bronchoscopy; jet ventilation; or elective use of a device to facilitate airway management such as a Glidescope : NO                > Unanticipated difficult intubation post pre-evaluation : NO      Aspiration of gastric contents        Aspiration of gastric contents : NO                    Surgical fire        Procedure requiring electrocautery/laser : NO                    Immediate perioperative  cardiac arrest        Cardiac arrest in OR or PACU : NO                    Unplanned hospital admission        Unplanned hospital admission for initially intended outpatient anesthesia service : NO      Unplanned ICU admission        Unplanned ICU admission related to anesthesia occurring within 24 hours of induction or start of MAC : NO  Surgical case cancellation        Cancellation of procedure after care already started by anesthesia care team : NO      Post-anesthesia transfer of care checklist/protocol to PACU        Transfer from OR to PACU upon case conclusion : YES              > Use of PACU transfer checklist/protocol : YES     (Includes the key elements of: patient identification, responsible practitioner identification (PACU nurse or advanced practitioner), discussion of pertinent history and procedure course, intraoperative anesthetic management, post-procedure plans, acknowledgement/questions)    Post-anesthesia transfer of care checklist/protocol to ICU        Transfer from OR to ICU upon case conclusion : NO                    Post-operative nausea/vomiting risk protocol        Post-operative nausea/vomiting risk protocol : NO  Patient > or = 18 with care initiated by anesthesia team that has a risk factor screen for post-op nausea/vomiting (Includes female, hx PONV, or motion sickness, non-smoker, intended opioid administration for post-op analgesia.)    Anaphylaxis        Anaphylaxis during anesthesia services : NO    (Inclusive of any suspected transfusion reaction in association with blood-bank confirmed blood product incompatibility)              Virgilio Frees, 09/09/2015 2:00 PM

## 2015-09-09 NOTE — Transfer of Care (Signed)
Procedure: Endoscopy and colonoscopy  IV General Anesthesia  PACU Phase 2  See: PACU Vital Signs  Pain well controlled  Hemodynamically Stable  Appropriate Mental Status  Tolerating Room Air  N/V controlled  No obvious anesthetic complications

## 2015-09-09 NOTE — Anesthesia Preprocedure Evaluation (Signed)
Anesthesia Evaluation    AIRWAY    Mallampati: II         CARDIOVASCULAR    cardiovascular exam normal       DENTAL         PULMONARY    pulmonary exam normal     OTHER FINDINGS                      Anesthesia Plan    ASA 3     general                     intravenous induction   Detailed anesthesia plan: general IV        Post op pain management: per surgeon    informed consent obtained    Plan discussed with CRNA.

## 2015-09-09 NOTE — H&P (Signed)
GI PRE PROCEDURE NOTE    Proceduralist Comments:   Review of Systems and Past Medical / Surgical History performed: Yes     Indications:Dyspepsia/pain, Screening for CRC    Previous Adverse Reaction to Anesthesia or Sedation (if yes, describe): No    Physical Exam / Laboratory Data (If applicable)   Airway Classification: Class II    General: Alert and cooperative  Lungs: Lungs clear to auscultation  Cardiac: RRR, normal S1S2.    Abdomen: Soft, non tender. Normal active bowel sounds  Other:     No labs drawn    American Society of Anesthesiologists (ASA) Physical Status Classification:   ASA 2 - Patient with mild systemic disease with no functional limitations    Planned Sedation:   Deep sedation with anesthesia    Attestation:   Katelyn Cortez has been reassessed immediately prior to the procedure and is an appropriate candidate for the planned sedation and procedure. Risks, benefits and alternatives to the planned procedure and sedation have been explained to the patient or guardian:  yes        Signed by: Guadalupe Dawn

## 2015-09-10 ENCOUNTER — Encounter: Payer: Self-pay | Admitting: Gastroenterology

## 2015-09-11 LAB — LAB USE ONLY - HISTORICAL SURGICAL PATHOLOGY

## 2015-12-01 IMAGING — CR [HOSPITAL] SHOULDER LT 2VWS MIN
1 series · 3 of 3 positions shown · non-contrast
Comparison: None.

HISTORY: Shoulder pain.
TECHNIQUE: Left shoulder x-ray series, 3 views.

[Series 1: internal rotate · 0.17mm/px · 3 of 3 slices shown]
[im 1/3]
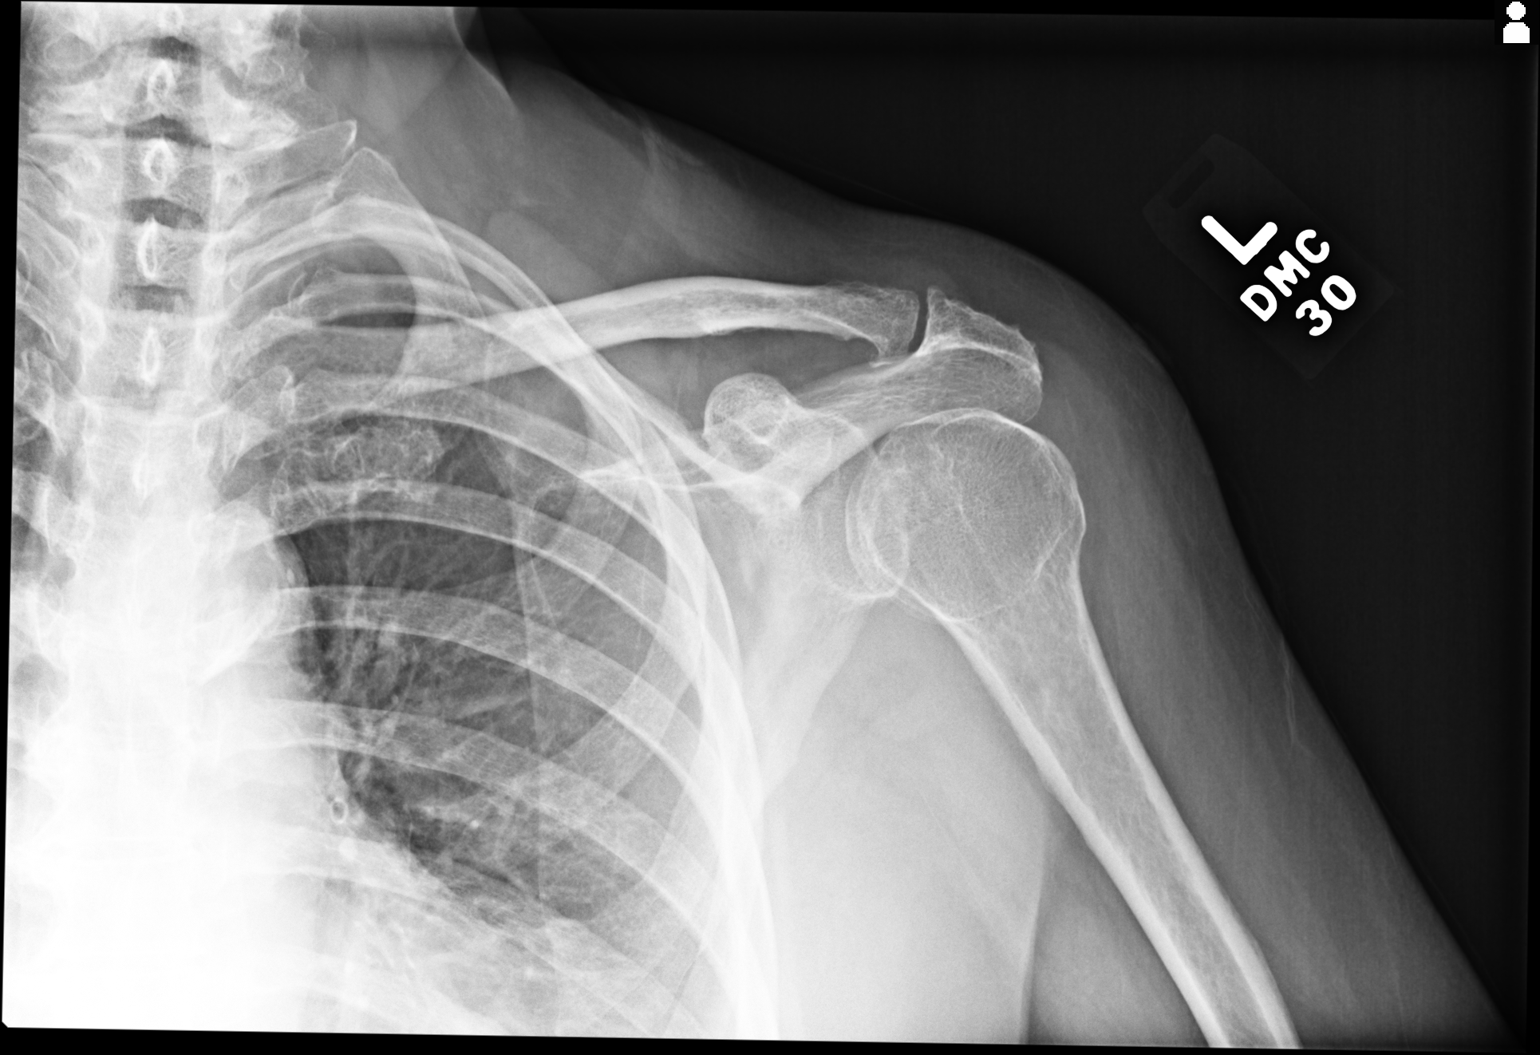
[im 2/3]
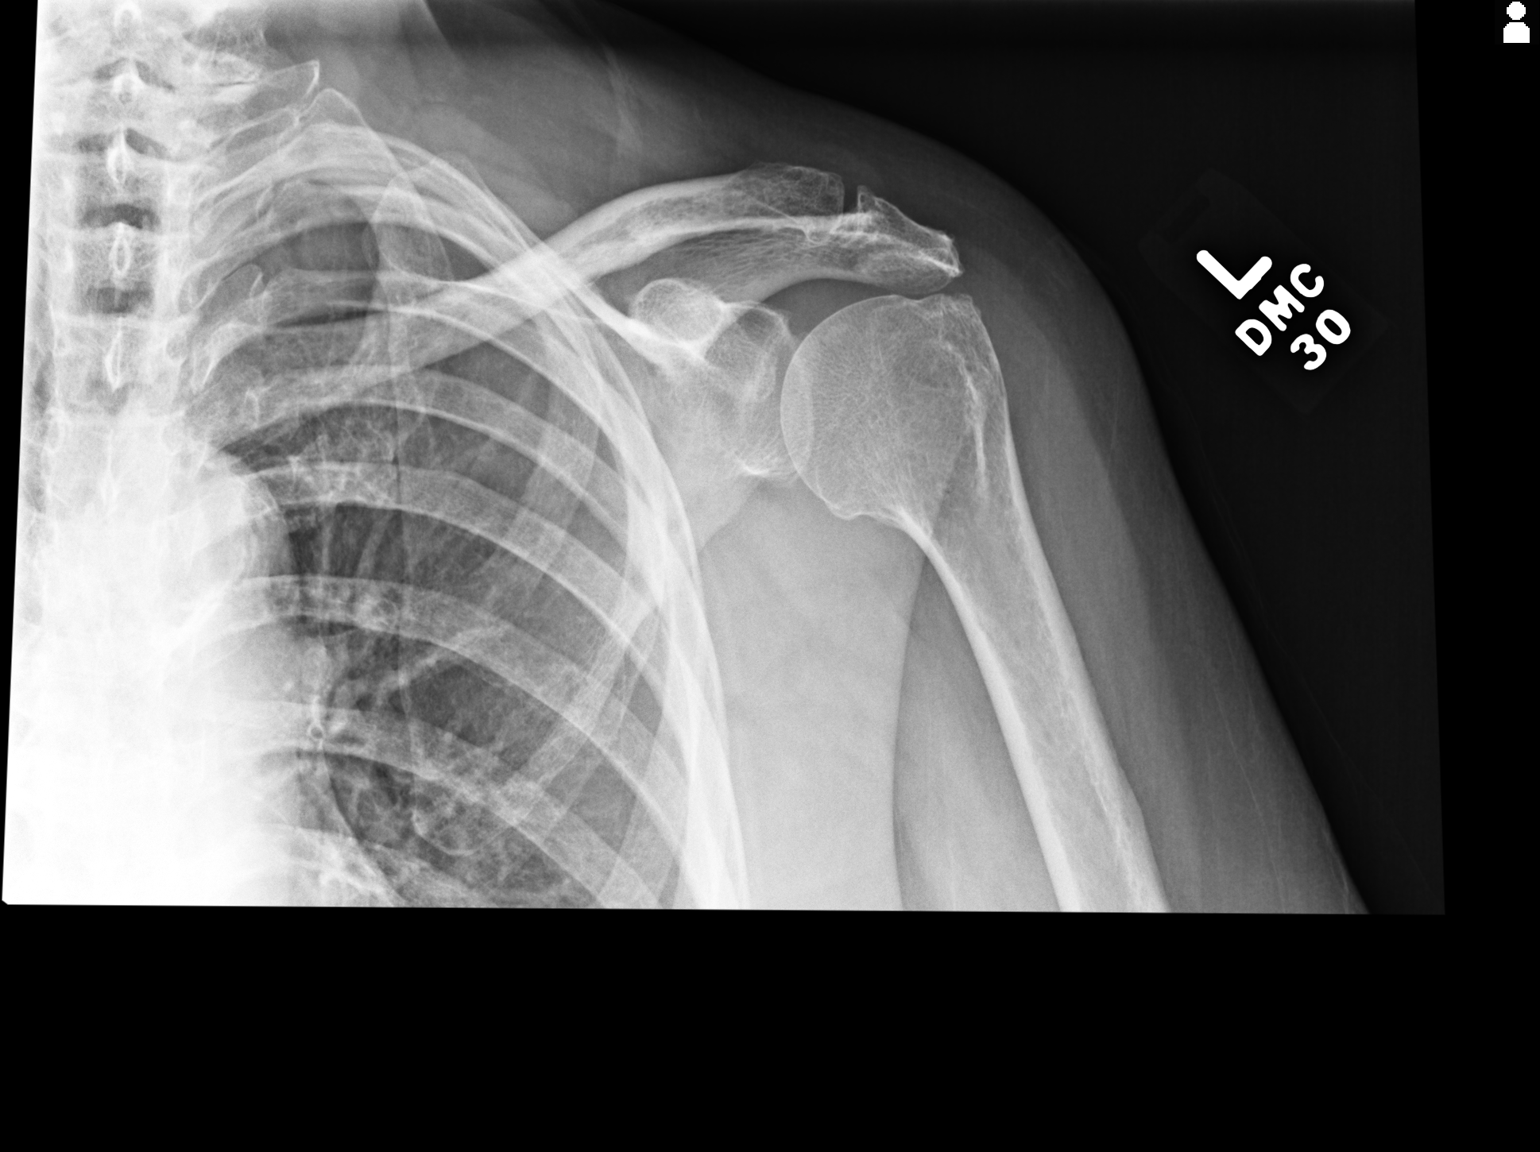
[im 3/3]
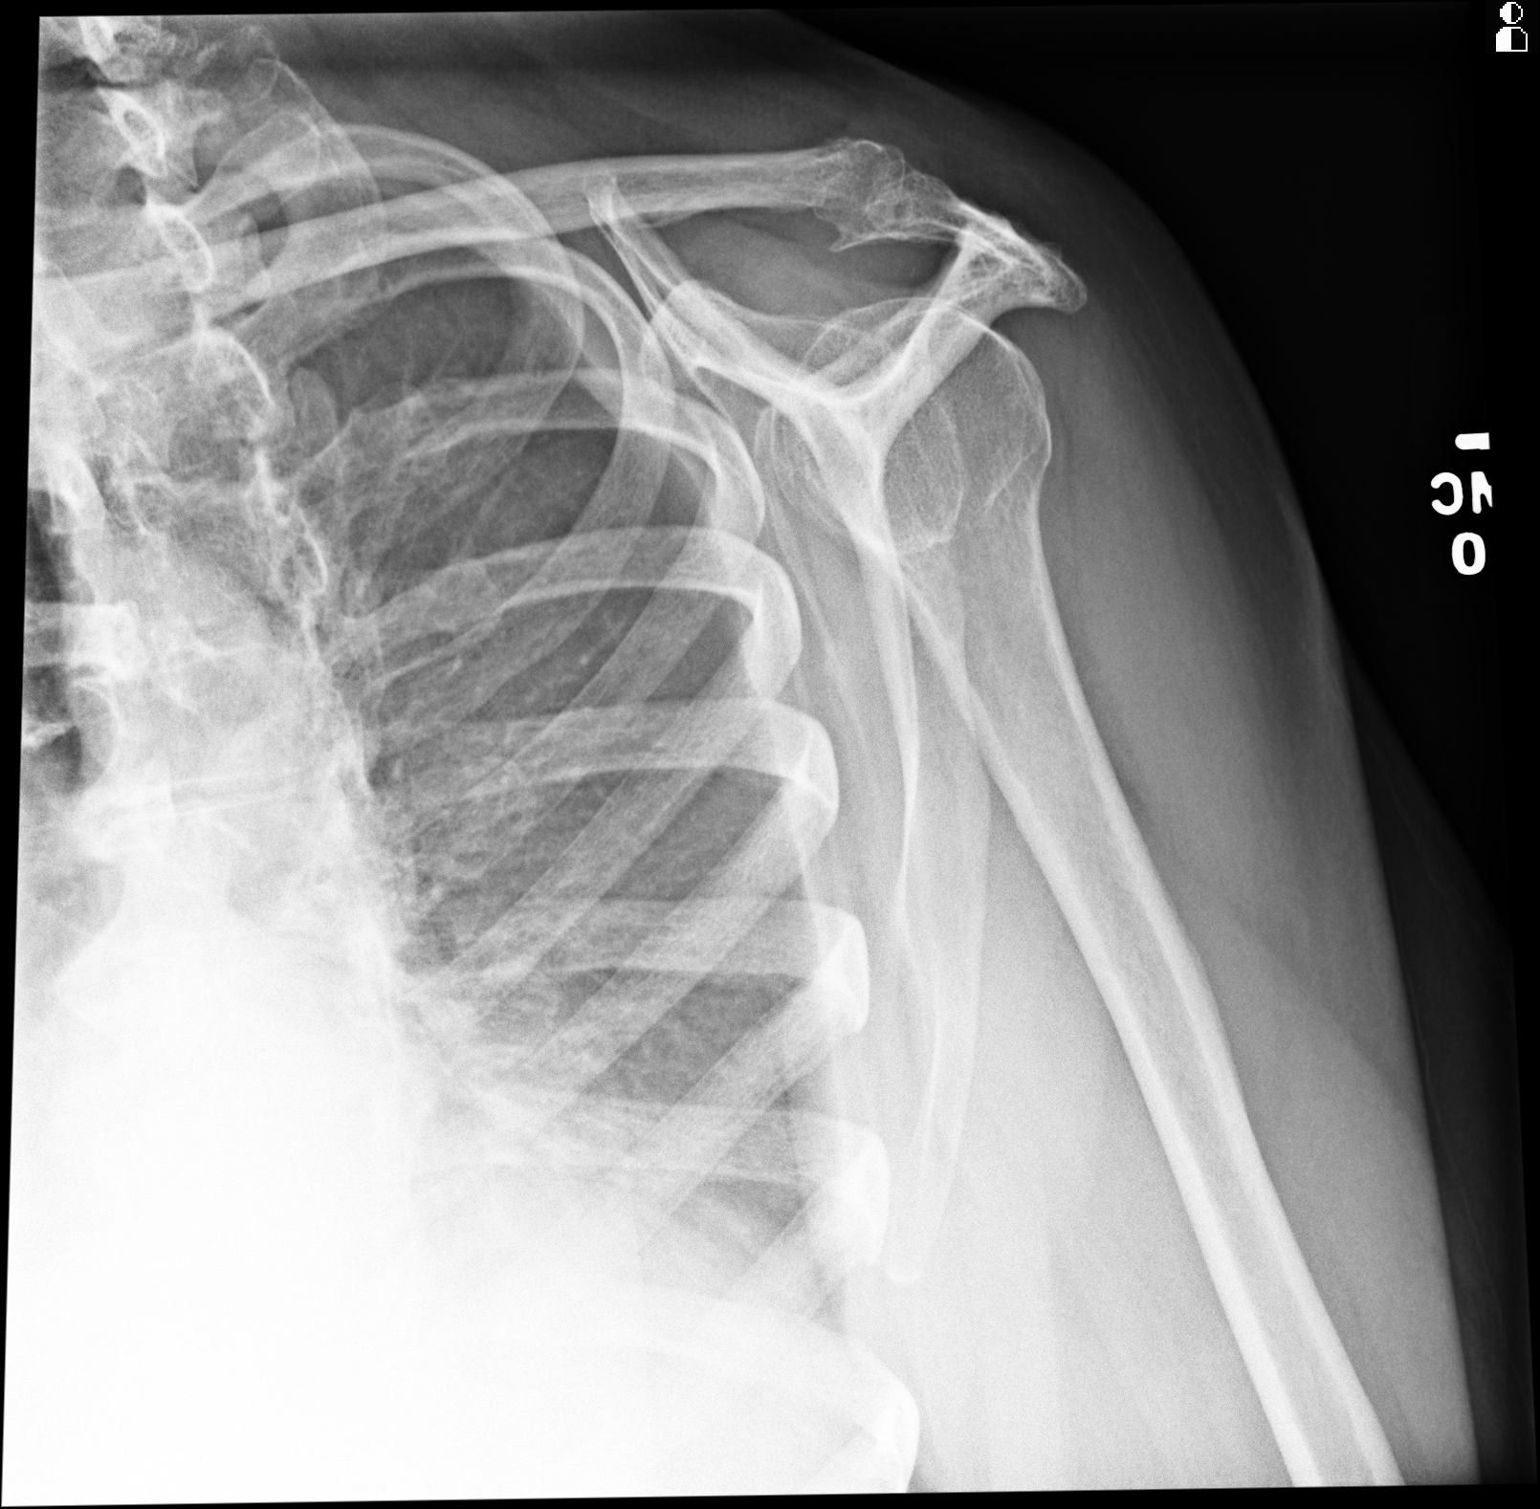

[3 of 3 positions shown; findings below may reference images not displayed]

FINDINGS: 3 views of left shoulder show mild to moderate glenohumeral spurring and mild to moderate AC joint spurring downward. No destructive process or fracture. Aorta atherosclerotic.
IMPRESSION: Mild to moderate osteoarthritis of left shoulder.

## 2015-12-01 IMAGING — CR [HOSPITAL] CSPINE 4-5VWS
1 series · 5 of 5 positions shown · non-contrast
Comparison: Nothing of cervical spine.

HISTORY: Rotator cuff syndrome, neck pain.
TECHNIQUE: C-spine x-rays, 5 views.

[Series 1: odontoid · 0.17mm/px · 5 of 5 slices shown]
[im 1/5]
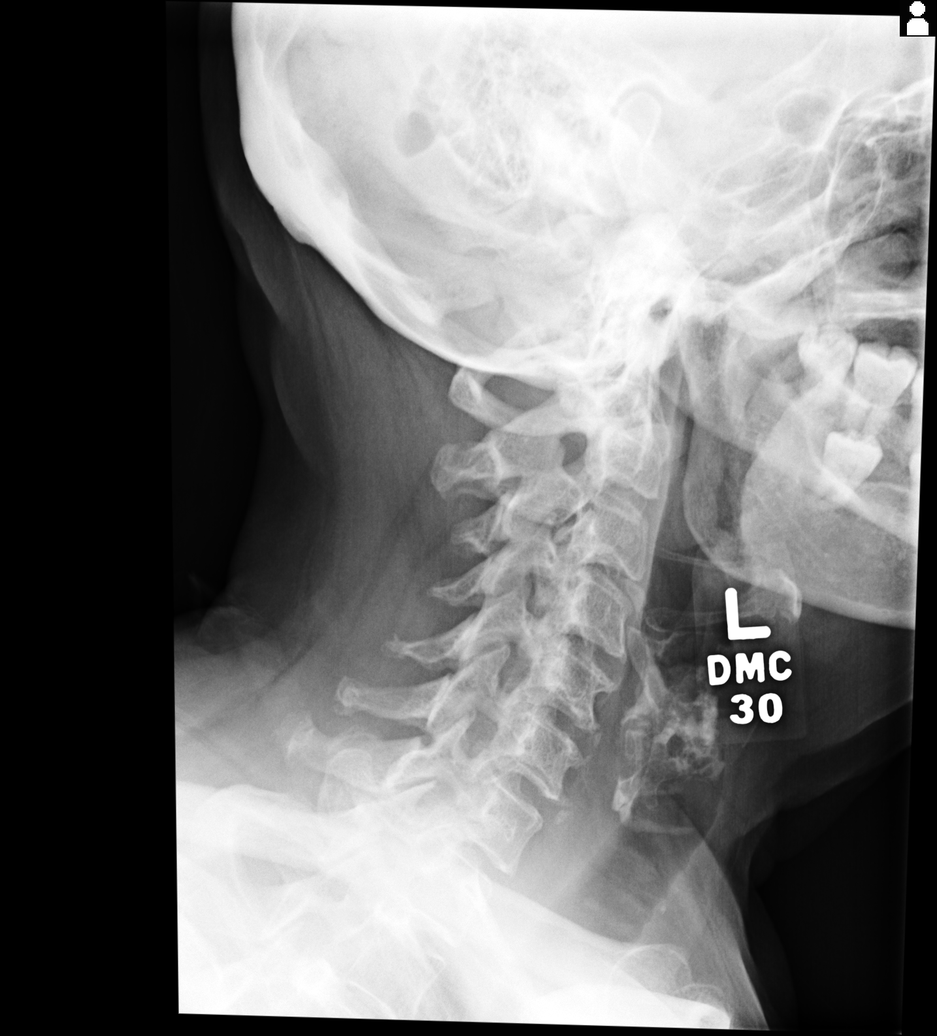
[im 2/5]
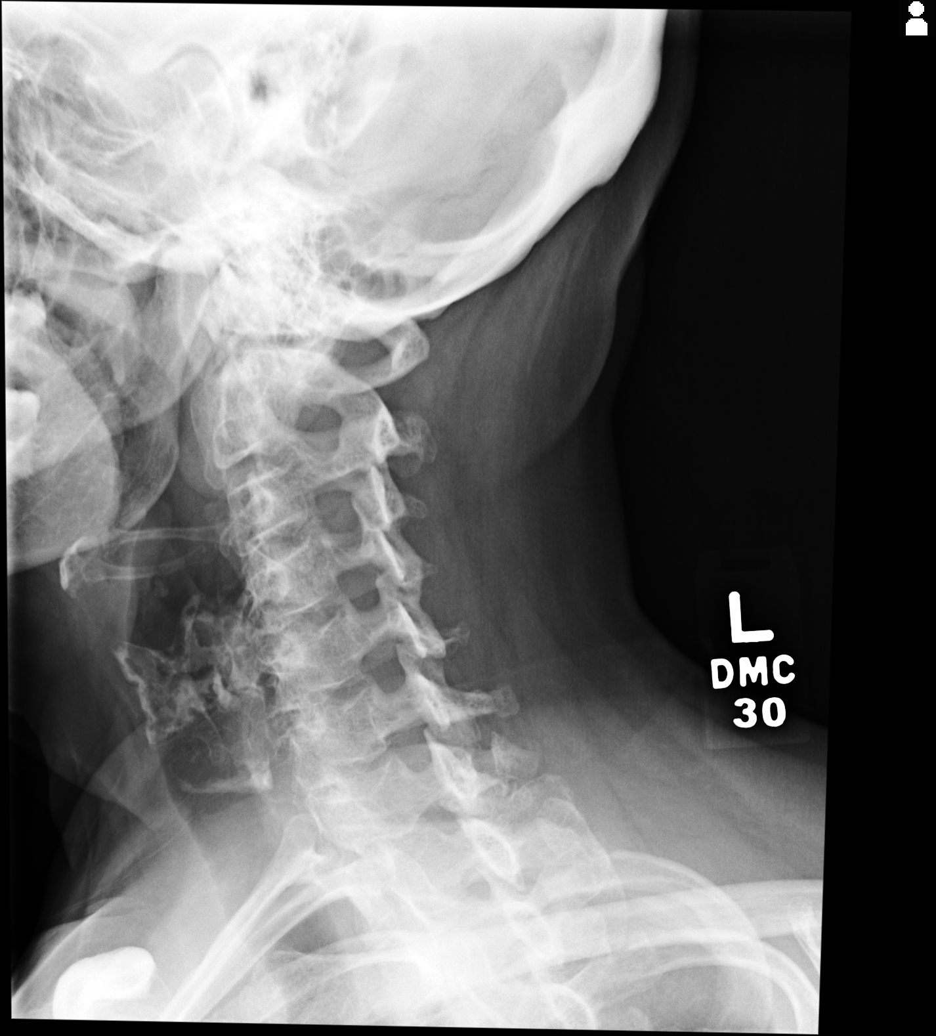
[im 3/5]
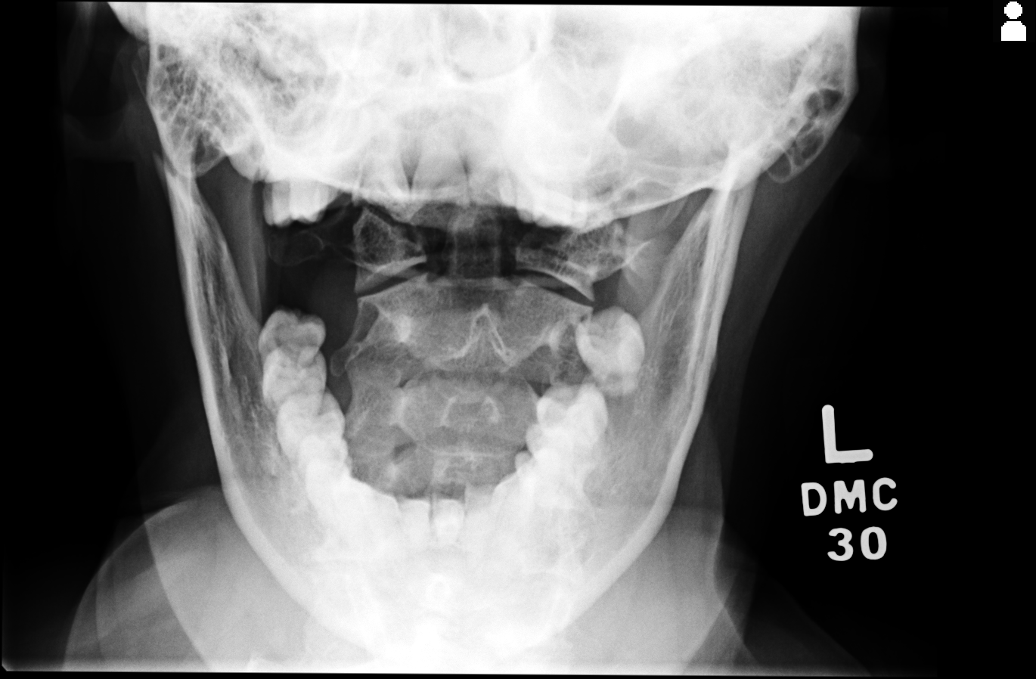
[im 4/5]
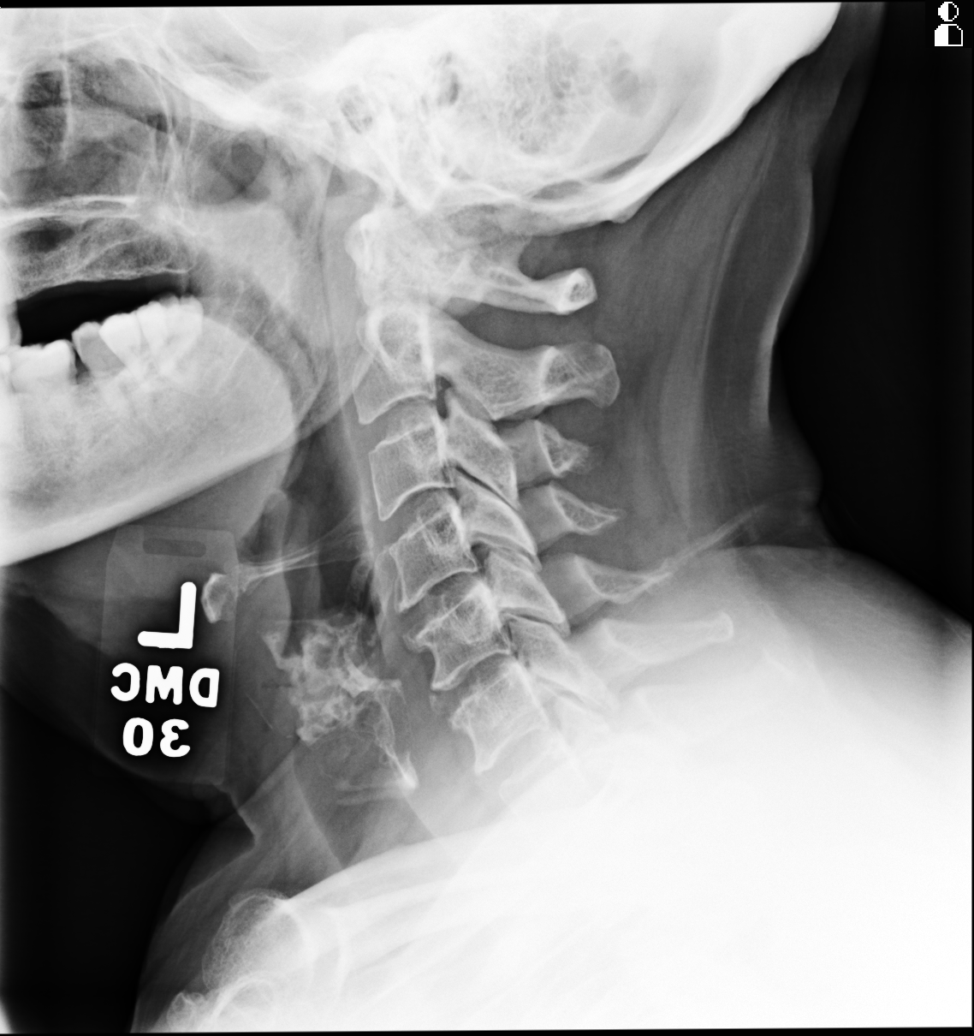
[im 5/5]
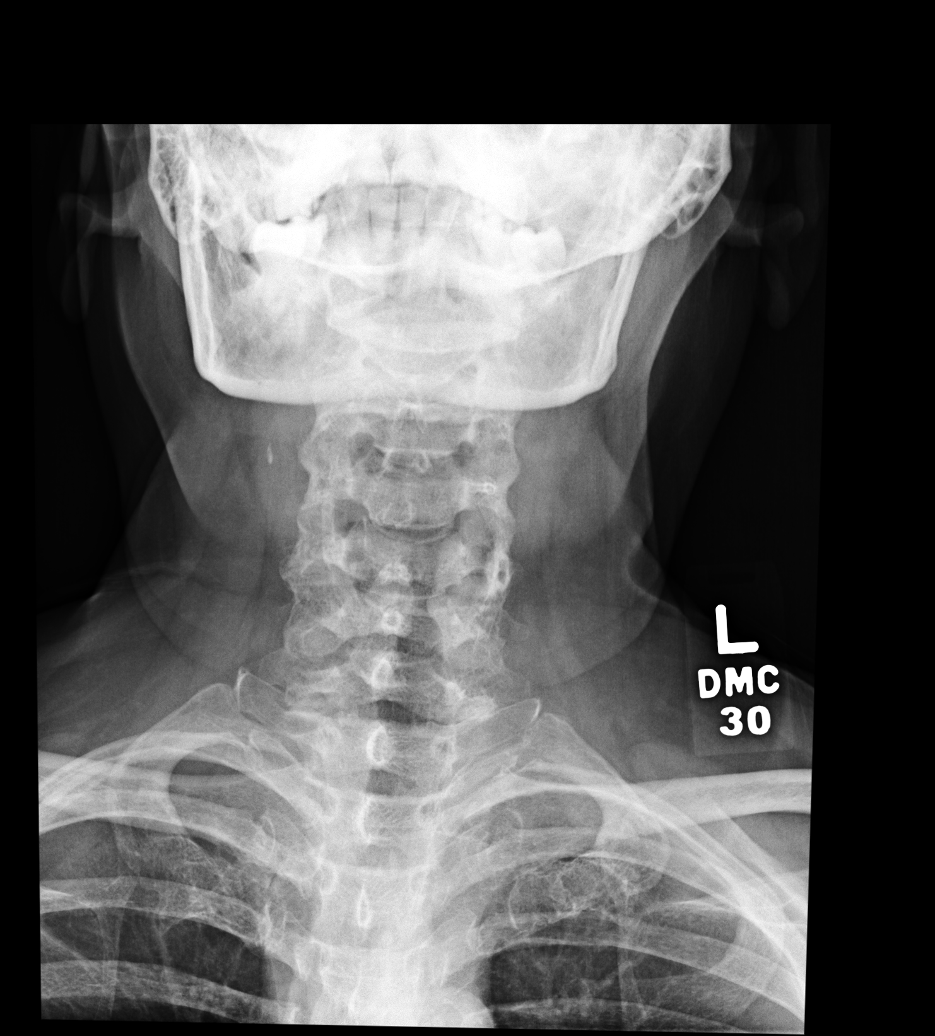

[5 of 5 positions shown; findings below may reference images not displayed]

FINDINGS: C1-7 are visualized. Curvature is normal. Mild endplate spurring, but no slippage or fracture. Facets normal. C1-C2 relationship normal. Neural foramina are patent. Disc space is preserved. Minimal atherosclerosis is seen at carotid bifurcations.
IMPRESSION: Mild cervical spondylosis.

## 2016-02-03 IMAGING — CR [HOSPITAL] SHOULDER LT 2VWS MIN
1 series · 3 of 3 positions shown · non-contrast
Comparison: None

HISTORY/INDICATIONS: Pain
TECHNIQUE: Left shoulder, three views.

[Series 1: internal rotate · 0.17mm/px · 3 of 3 slices shown]
[im 1/3]
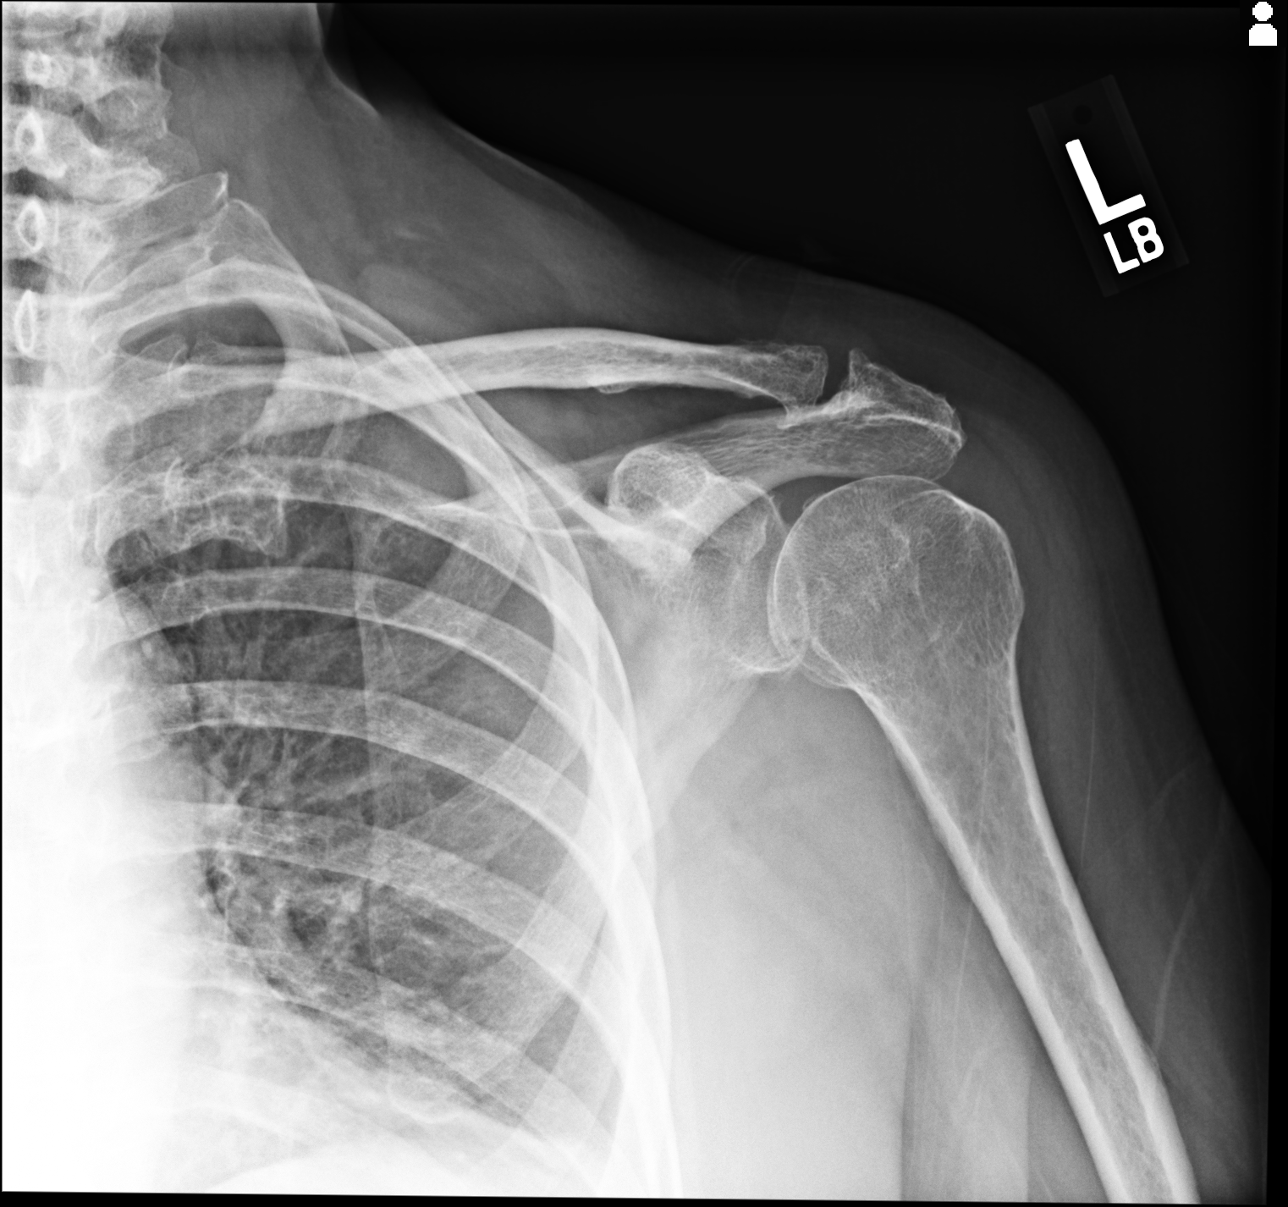
[im 2/3]
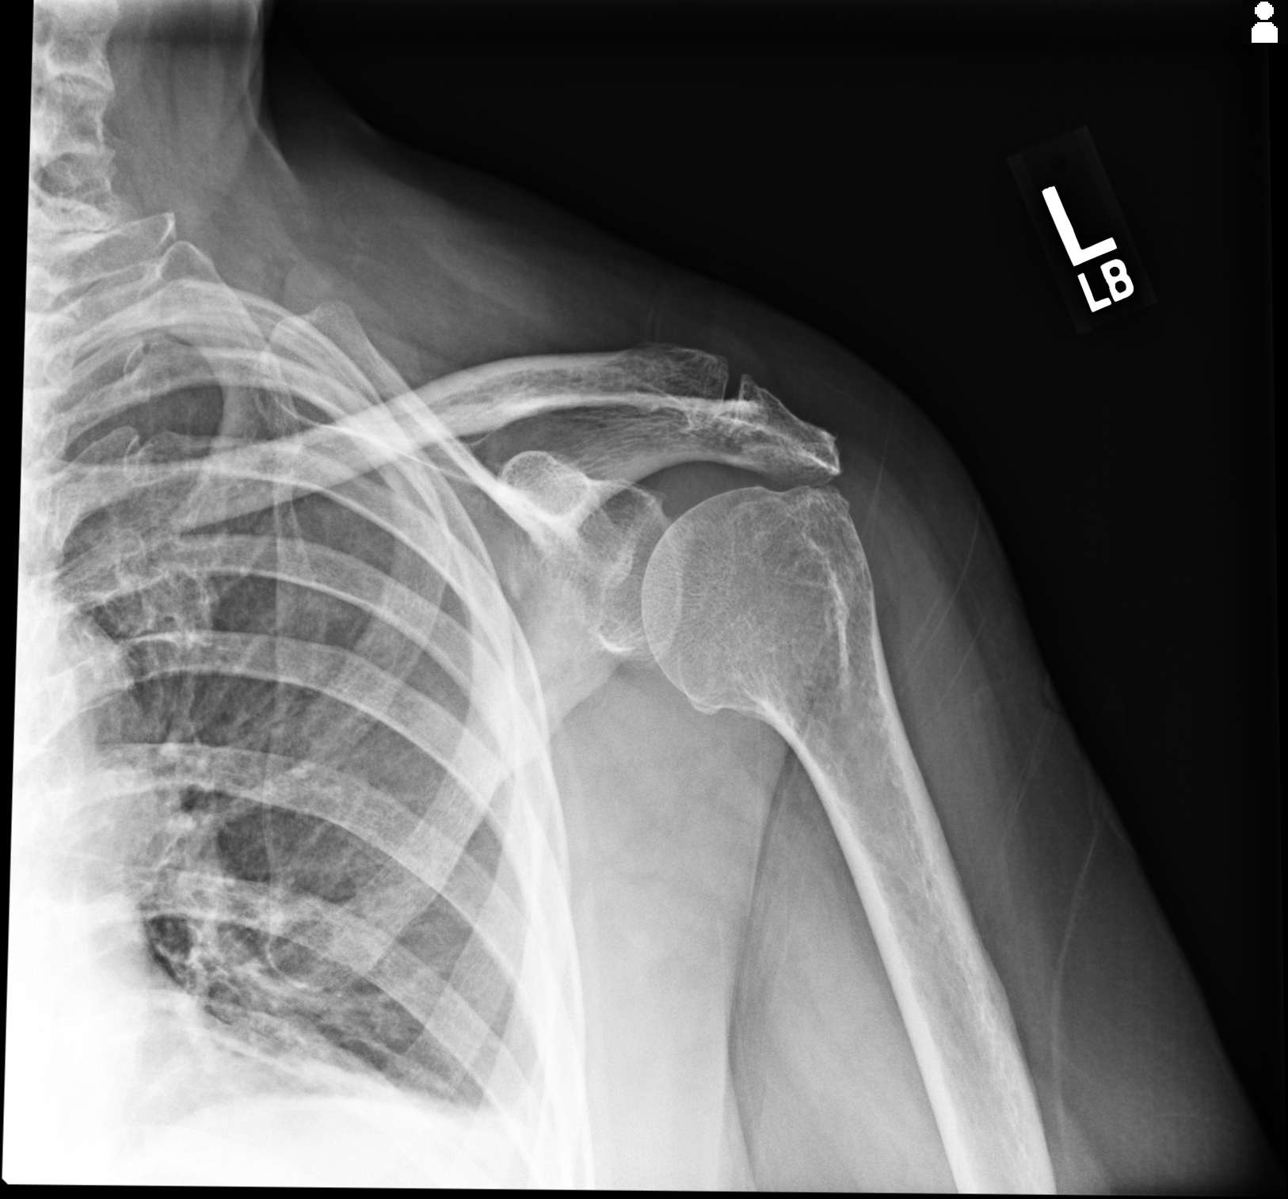
[im 3/3]
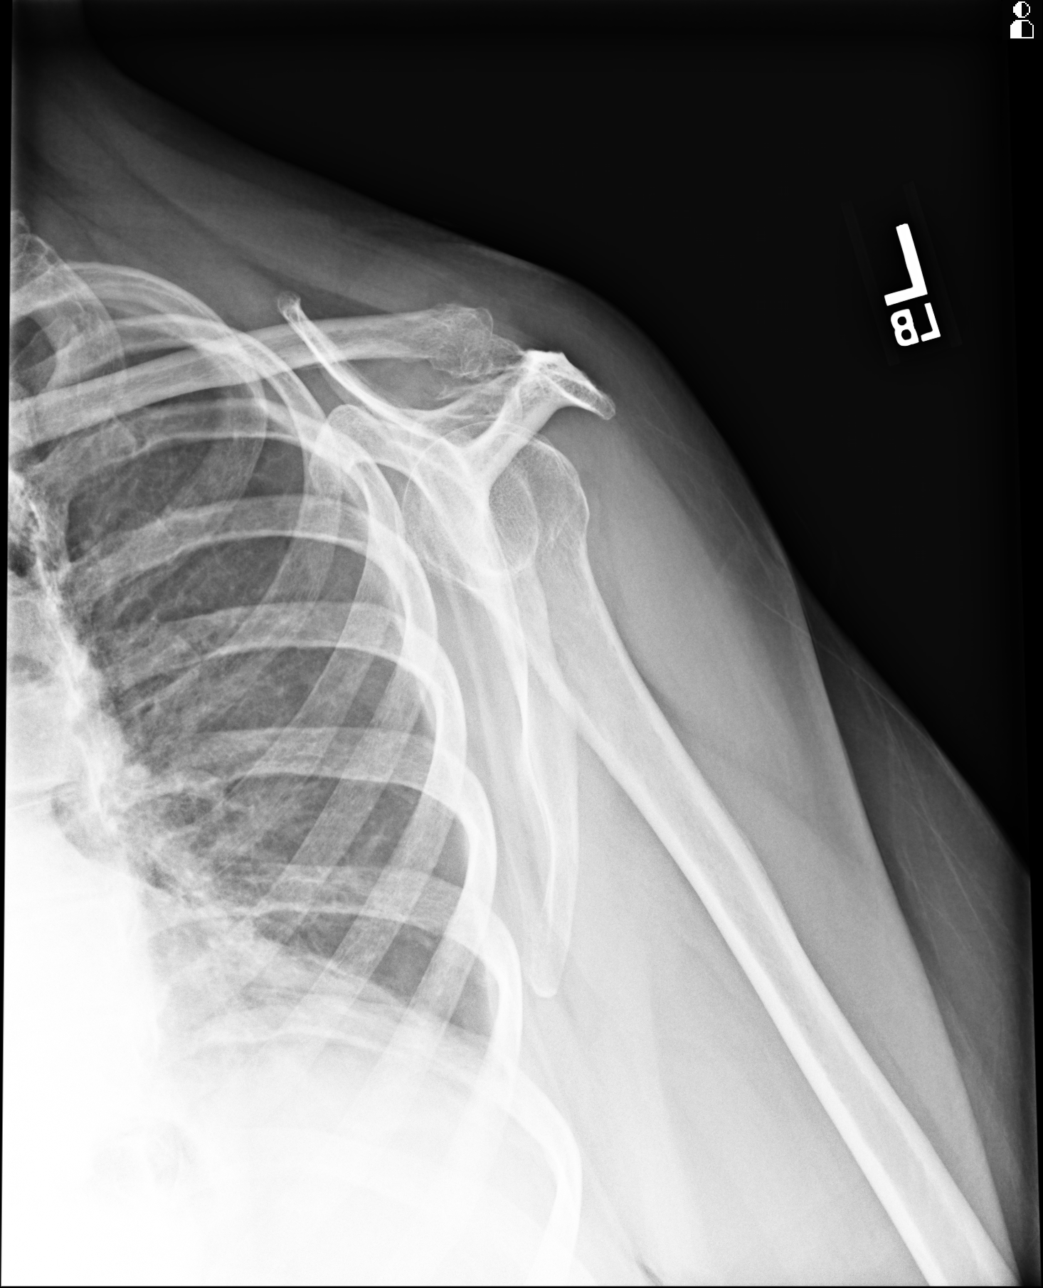

[3 of 3 positions shown; findings below may reference images not displayed]

FINDINGS: Three views of the left shoulder demonstrate advanced acromioclavicular osteoarthritis with inferiorly projecting osteophytes. Correlate clinically for impingement syndrome. There is moderate glenohumeral osteoarthritis. Generalized osseous demineralization without fracture. Soft tissues appear unremarkable. Atherosclerotic calcifications are not present.
IMPRESSION: Acromioclavicular and glenohumeral osteoarthritis as discussed above. Correlate clinically for impingement syndrome.

## 2016-08-06 IMAGING — MG MAMMO SCRN BIL W/CAD TOMO
6 of 12 series · 6 of 36 positions shown · non-contrast
Comparison: none

Images Obtained from [HOSPITAL] Imaging
CLINICAL RA REF: Mammo Scrn bilateral Digital) W/Cad Routine with Tomosynthesis.
Digital images were generated from the 3D Tomosynthesis data acquired during the exam.
Comparison is made to exams dated:  07/22/2015, 03/13/2013, and 01/11/2012 [HOSPITAL] - [HOSPITAL].
There are scattered fibroglandular elements in both breasts.
Current study was also evaluated with a Computer Aided Detection (CAD) system.
There are benign calcifications in both breasts.
No significant abnormalities are seen in either breast.
There has been no significant interval change.

[R CC (1 of 2)]
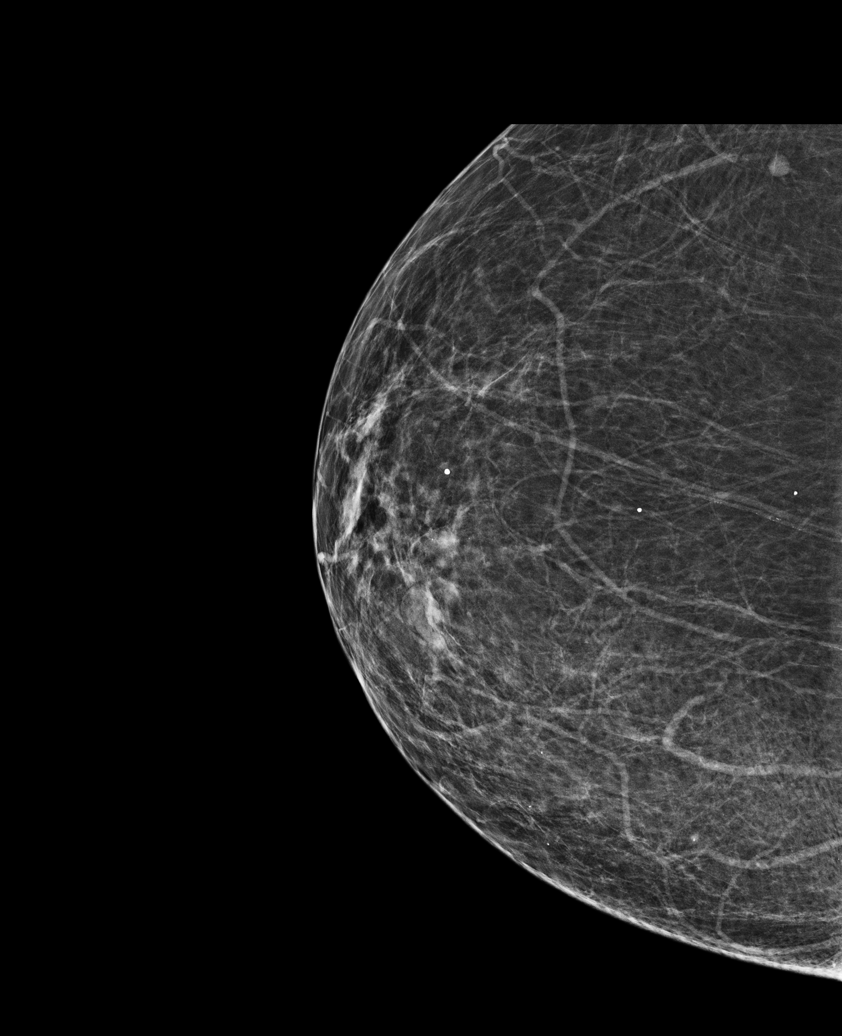

[L CC (1 of 2)]
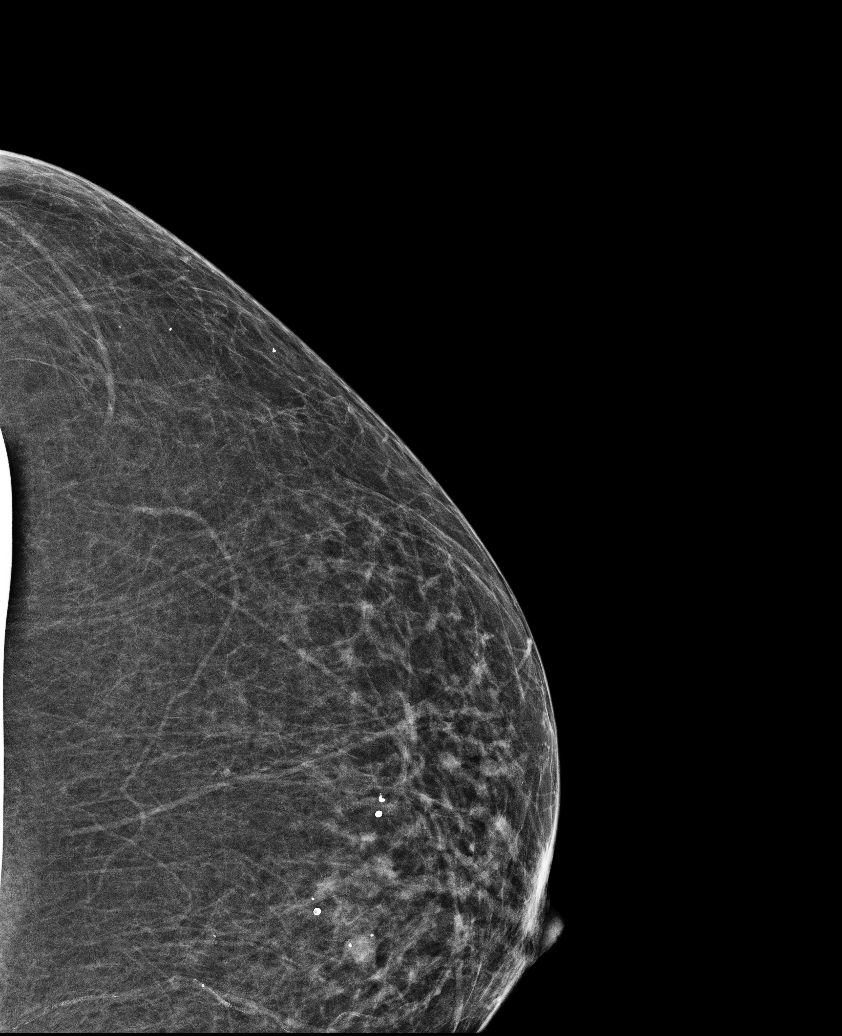

[L MLO]
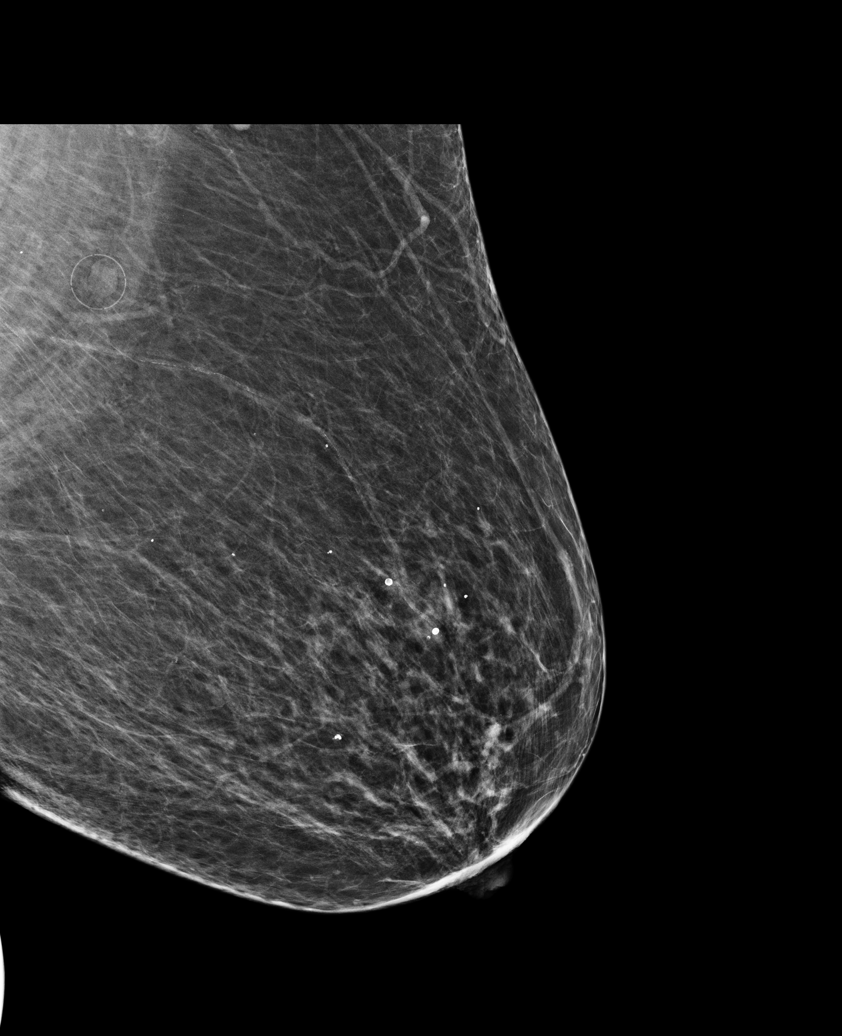

[R MLO]
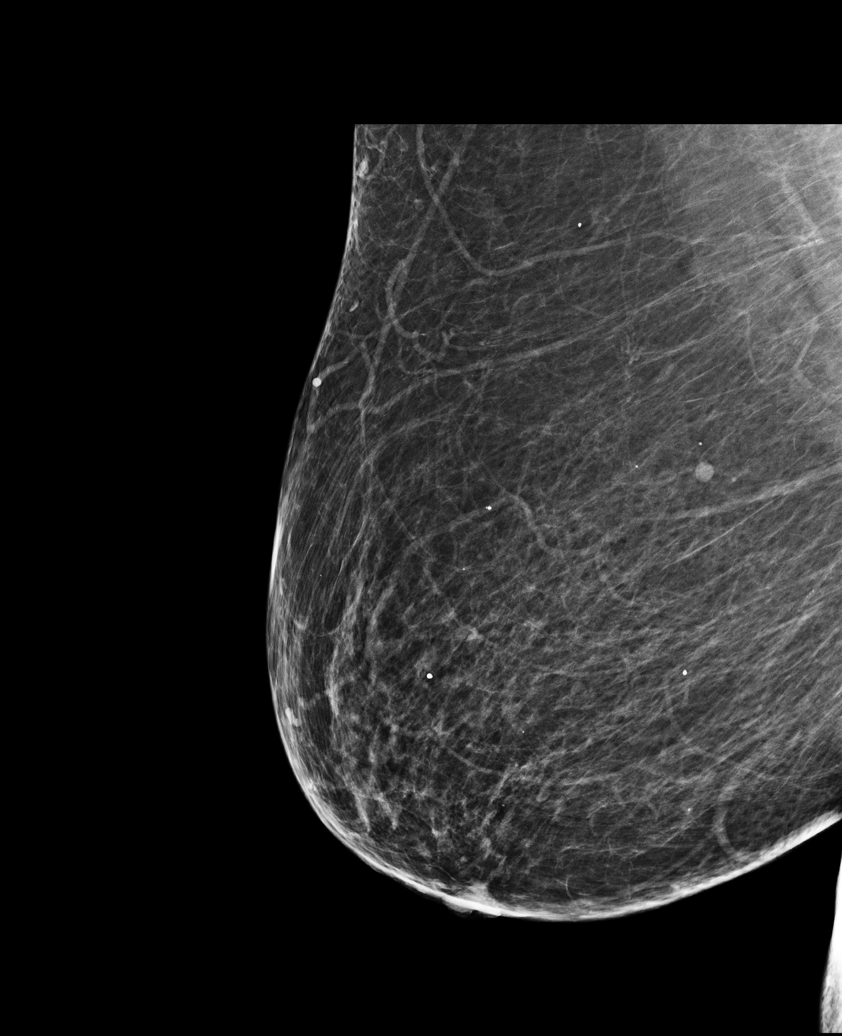

[R CC (2 of 2)]
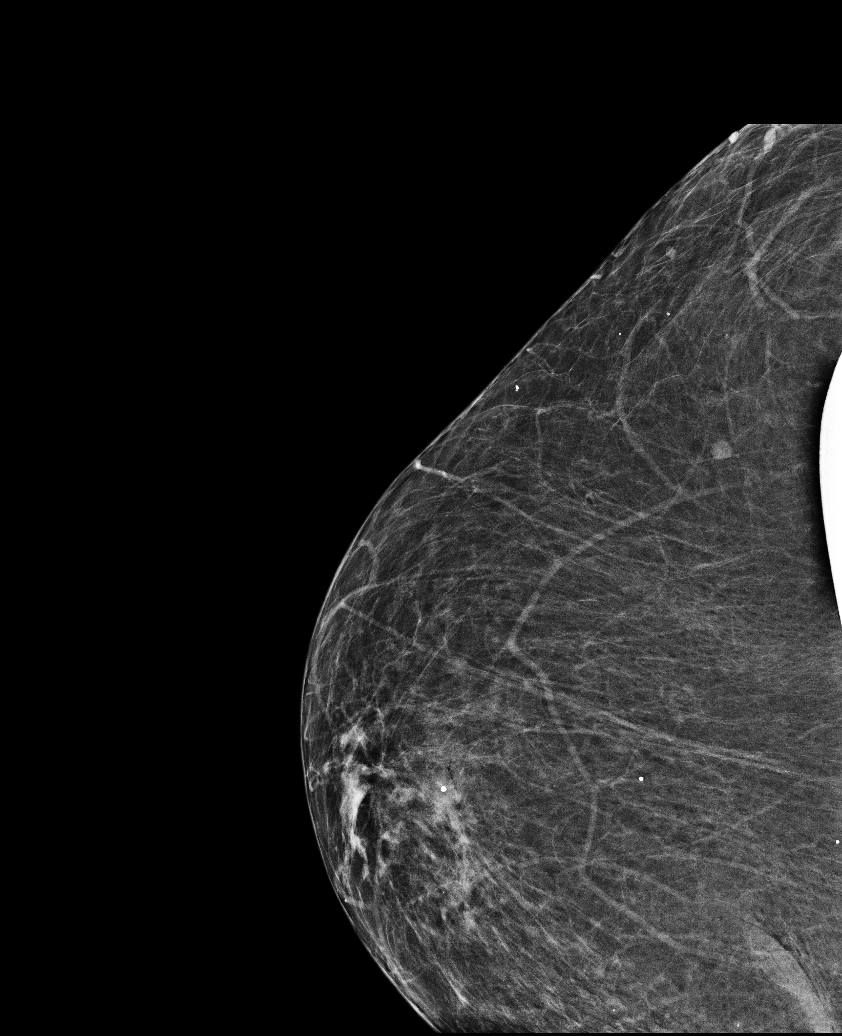

[L CC (2 of 2)]
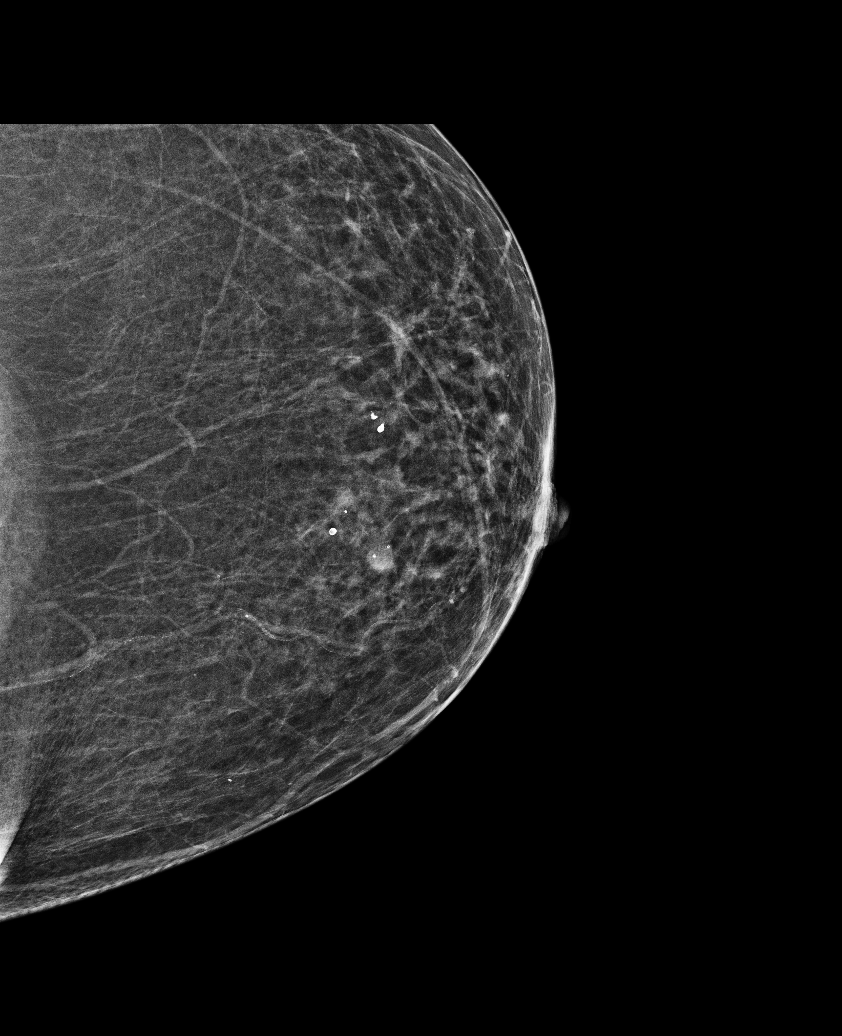

[6 of 36 positions shown; findings below may reference images not displayed]

IMPRESSION: There is no mammographic evidence of malignancy. A 1 year screening mammogram is recommended.
mwm/penrad:08/06/2016 [DATE]
letter sent: Normal
Mammogram BI-RADS: 2 Benign

## 2017-03-11 ENCOUNTER — Encounter: Payer: Self-pay | Admitting: Certified Registered"

## 2017-03-11 ENCOUNTER — Other Ambulatory Visit: Payer: Self-pay

## 2017-03-11 ENCOUNTER — Ambulatory Visit: Payer: Medicare Other | Admitting: Certified Registered"

## 2017-03-11 ENCOUNTER — Encounter
Admission: RE | Disposition: A | Discharge status: Home / Self Care | Payer: Self-pay | Source: Ambulatory Visit | Attending: Ophthalmology

## 2017-03-11 ENCOUNTER — Ambulatory Visit
Admission: RE | Admit: 2017-03-11 | Discharge: 2017-03-11 | Disposition: A | Discharge status: Home / Self Care | Payer: Medicare Other | Source: Ambulatory Visit | Attending: Ophthalmology | Admitting: Ophthalmology

## 2017-03-11 DIAGNOSIS — E039 Hypothyroidism, unspecified: Secondary | ICD-10-CM | POA: Insufficient documentation

## 2017-03-11 DIAGNOSIS — I10 Essential (primary) hypertension: Secondary | ICD-10-CM | POA: Insufficient documentation

## 2017-03-11 DIAGNOSIS — H3341 Traction detachment of retina, right eye: Secondary | ICD-10-CM | POA: Insufficient documentation

## 2017-03-11 DIAGNOSIS — Z7984 Long term (current) use of oral hypoglycemic drugs: Secondary | ICD-10-CM | POA: Insufficient documentation

## 2017-03-11 DIAGNOSIS — E119 Type 2 diabetes mellitus without complications: Secondary | ICD-10-CM | POA: Insufficient documentation

## 2017-03-11 DIAGNOSIS — E785 Hyperlipidemia, unspecified: Secondary | ICD-10-CM | POA: Insufficient documentation

## 2017-03-11 DIAGNOSIS — M1990 Unspecified osteoarthritis, unspecified site: Secondary | ICD-10-CM | POA: Insufficient documentation

## 2017-03-11 HISTORY — PX: VITRECTOMY, LASER/SILICONE OIL: SHX5715

## 2017-03-11 HISTORY — PX: VITRECTOMY, MEMBRANE PEELING: SHX5717

## 2017-03-11 HISTORY — PX: VITRECTOMY, 25 GAUGE, MECHANICAL, PARS PLANA: SHX5707

## 2017-03-11 LAB — GLUCOSE WHOLE BLOOD - POCT: Whole Blood Glucose POCT: 156 mg/dL — ABNORMAL HIGH (ref 70–100)

## 2017-03-11 SURGERY — VITRECTOMY 25 GAUGE, MECHANICAL, PARS PLANA
Anesthesia: Anesthesia MAC / Sedation | Laterality: Right | Wound class: Clean

## 2017-03-11 MED ORDER — PHENYLEPHRINE HCL 2.5 % OP SOLN
1.0000 [drp] | OPHTHALMIC | Status: AC
Start: 2017-03-11 — End: 2017-03-11
  Administered 2017-03-11 (×3): 1 [drp] via OPHTHALMIC

## 2017-03-11 MED ORDER — STERILE WATER FOR IRRIGATION IR SOLN
Status: DC | PRN
Start: 2017-03-11 — End: 2017-03-11
  Administered 2017-03-11: 200 mL

## 2017-03-11 MED ORDER — INDOCYANINE GREEN 25 MG IV SOLR
INTRAVENOUS | Status: DC | PRN
Start: 2017-03-11 — End: 2017-03-11
  Administered 2017-03-11: 5 mg via INTRAVENOUS

## 2017-03-11 MED ORDER — HYPROMELLOSE (GONIOSCOPIC) 2.5 % OP SOLN
OPHTHALMIC | Status: DC | PRN
Start: 2017-03-11 — End: 2017-03-11
  Administered 2017-03-11: 2 mL via OPHTHALMIC

## 2017-03-11 MED ORDER — TETRACAINE HCL 0.5 % OP SOLN
OPHTHALMIC | Status: AC
Start: 2017-03-11 — End: ?
  Filled 2017-03-11: qty 4

## 2017-03-11 MED ORDER — BUPIVACAINE HCL (PF) 0.75 % IJ SOLN
INTRAMUSCULAR | Status: DC | PRN
Start: 2017-03-11 — End: 2017-03-11
  Administered 2017-03-11: 2 mL

## 2017-03-11 MED ORDER — POLYMYXIN B-TRIMETHOPRIM 10000-0.1 UNIT/ML-% OP SOLN
1.0000 [drp] | Freq: Four times a day (QID) | OPHTHALMIC | 1 refills | Status: AC
Start: 2017-03-11 — End: 2017-04-30
  Filled 2017-03-11: qty 10, 50d supply, fill #0

## 2017-03-11 MED ORDER — DEXAMETHASONE SOD PHOSPHATE PF 10 MG/ML IJ SOLN
INTRAMUSCULAR | Status: DC | PRN
Start: 2017-03-11 — End: 2017-03-11
  Administered 2017-03-11: 5 mg

## 2017-03-11 MED ORDER — SILICONE OIL 1000
Status: DC | PRN
Start: 2017-03-11 — End: 2017-03-11
  Administered 2017-03-11: 5 mL via INTRAVITREAL

## 2017-03-11 MED ORDER — SODIUM CHLORIDE 0.9 % IV SOLN
INTRAVENOUS | Status: DC
Start: 2017-03-11 — End: 2017-03-11

## 2017-03-11 MED ORDER — TROPICAMIDE 1 % OP SOLN
1.0000 [drp] | OPHTHALMIC | Status: AC
Start: 2017-03-11 — End: 2017-03-11
  Administered 2017-03-11 (×3): 1 [drp] via OPHTHALMIC

## 2017-03-11 MED ORDER — NEOMYCIN-POLYMYXIN-DEXAMETH 3.5-10000-0.1 OP OINT
TOPICAL_OINTMENT | OPHTHALMIC | Status: DC | PRN
Start: 2017-03-11 — End: 2017-03-11
  Administered 2017-03-11: 3.5 g via OPHTHALMIC

## 2017-03-11 MED ORDER — LIDOCAINE HCL 2 % IJ SOLN
INTRAMUSCULAR | Status: DC | PRN
Start: 2017-03-11 — End: 2017-03-11
  Administered 2017-03-11: 2 mL

## 2017-03-11 MED ORDER — FENTANYL CITRATE (PF) 50 MCG/ML IJ SOLN (WRAP)
INTRAMUSCULAR | Status: AC
Start: 2017-03-11 — End: ?
  Filled 2017-03-11: qty 2

## 2017-03-11 MED ORDER — LACTATED RINGERS IV SOLN
INTRAVENOUS | Status: DC
Start: 2017-03-11 — End: 2017-03-11

## 2017-03-11 MED ORDER — CEFAZOLIN SODIUM 1 G IJ SOLR
INTRAMUSCULAR | Status: DC | PRN
Start: 2017-03-11 — End: 2017-03-11
  Administered 2017-03-11: 50 mg

## 2017-03-11 MED ORDER — MIDAZOLAM HCL 2 MG/2ML IJ SOLN
INTRAMUSCULAR | Status: AC
Start: 2017-03-11 — End: ?
  Filled 2017-03-11: qty 2

## 2017-03-11 MED ORDER — MIDAZOLAM HCL 2 MG/2ML IJ SOLN
INTRAMUSCULAR | Status: DC | PRN
Start: 2017-03-11 — End: 2017-03-11
  Administered 2017-03-11 (×2): 1 mg via INTRAVENOUS

## 2017-03-11 MED ORDER — PHENYLEPHRINE HCL 2.5 % OP SOLN
OPHTHALMIC | Status: AC
Start: 2017-03-11 — End: ?
  Filled 2017-03-11: qty 1

## 2017-03-11 MED ORDER — TROPICAMIDE 1 % OP SOLN
OPHTHALMIC | Status: AC
Start: 2017-03-11 — End: ?
  Filled 2017-03-11: qty 15

## 2017-03-11 MED ORDER — PREDNISOLONE ACETATE 1 % OP SUSP
1.0000 [drp] | Freq: Four times a day (QID) | OPHTHALMIC | 1 refills | Status: AC
Start: 2017-03-11 — End: 2017-04-30
  Filled 2017-03-11: qty 10, 50d supply, fill #0

## 2017-03-11 MED ORDER — FENTANYL CITRATE (PF) 50 MCG/ML IJ SOLN (WRAP)
INTRAMUSCULAR | Status: DC | PRN
Start: 2017-03-11 — End: 2017-03-11
  Administered 2017-03-11 (×2): 50 ug via INTRAVENOUS

## 2017-03-11 MED ORDER — BSS IO SOLN
INTRAOCULAR | Status: DC | PRN
Start: 2017-03-11 — End: 2017-03-11
  Administered 2017-03-11: 20 mL

## 2017-03-11 MED ORDER — TETRACAINE HCL 0.5 % OP SOLN
1.0000 [drp] | Freq: Once | OPHTHALMIC | Status: AC
Start: 2017-03-11 — End: 2017-03-11
  Administered 2017-03-11: 1 [drp] via OPHTHALMIC

## 2017-03-11 MED ORDER — TETRACAINE HCL 0.5 % OP SOLN
OPHTHALMIC | Status: DC | PRN
Start: 2017-03-11 — End: 2017-03-11
  Administered 2017-03-11: 2 [drp] via OPHTHALMIC

## 2017-03-11 SURGICAL SUPPLY — 67 items
CANNULA DUAL BORE 25G .5MM (Opthamology Supply)
CANNULA OPHTHALMIC 25GA FLEXTIP 2 BORE (Opthamology Supply) IMPLANT
CANNULA OPHTHALMIC SHORT SOFT TIP OD25 (Cannula) ×2 IMPLANT
CANNULA OPHTHALMIC SHORT SOFT TIP OD25 GA ODSEC.8 MM GRIESHABER (Cannula) IMPLANT
CANNULA OPTH FLXTP 25GA 2 BORE (Opthamology Supply) IMPLANT
CANNULA OPTH SHRT GRSHBR 25GA .8MM STRL (Cannula) ×2
CANNULA SOFT TIP 25G .8MM (Cannula) ×1
CAUTERY 25GA (Cautery) IMPLANT
CONSTELLATION AUTO GAS FILL PK (Opthamology Supply)
CONSTELLATION FRAGMATOM ACC PK (Opthamology Supply)
CONSTELLATION VFC PK (Opthamology Supply) ×1
DRESSING TEGADERM 4X4X3/4IN (Dressing) ×1
DRESSING TRANSPARENT L4 3/4 IN X W4 IN (Dressing) ×2 IMPLANT
DRESSING TRANSPARENT L4 3/4 IN X W4 IN POLYURETHANE ADHESIVE (Dressing) ×2 IMPLANT
DRESSING TRNS PU STD TGDRM 4.75X4IN LF (Dressing) ×2
FORCEP MAX GRIP 25G (Procedure Accessories) ×1
FORCEP REVOLUTN DSP ILM 25GA (Opthamology Supply) ×1
FORCEPS OPHTHALMIC 360 D MICRO OD25 GA (Opthamology Supply) ×2 IMPLANT
FORCEPS OPHTHALMIC 360 D MICRO OD25 GA GRIESHABER REVOLUTION RETINAL (Opthamology Supply) ×2 IMPLANT
FORCEPS OPHTHALMIC MICRO TEXTURE BROAD (Procedure Accessories) ×2 IMPLANT
FORCEPS OPHTHALMIC MICRO TEXTURE BROAD GRASP SURFACE BLUNT DISTAL END (Procedure Accessories) IMPLANT
FORCEPS OPTH 360D MIC GRSH RV 25GA STRL (Opthamology Supply) ×2
FORCEPS OPTH GRSHBR MAXGRIP 25+ GA STRL (Procedure Accessories) ×2
GOWN SMART IMPERVIOUS LARGE (Gown) ×3 IMPLANT
HANDLE LGHT LF STRL ADP LGHT CNTRL + TCH (Other)
HANDLE LIGHT ADAPTIVE LIGHT CONTROL PLUS (Other) IMPLANT
HANDLE LIGHT ADAPTIVE LIGHT CONTROL PLUS TECHNOLOGY SNAP ON LENS TOUCH (Other) IMPLANT
HANDLE TRUMPF LIGHT  DISP (Other)
LABEL MED LF STRL PK CSTM RTNA DISP (Other) ×2
LABEL MEDICAL PACK CUSTOM RETINA (Other) ×2 IMPLANT
LABEL MEDICAL PACK CUSTOM RETINA CL22746 CUSTOM MED LABEL PACK (Other) ×2 IMPLANT
LABEL SHEET CUSTOM RETINA (Other) ×1
LANCE V FULL HNDL 20G (Blade) IMPLANT
LENS VITRECTOMY FLAT (Opthamology Supply) IMPLANT
LENS VITRECTOMY FLAT DISPOSABLE VITRECTOMY LENS: FLAT (Opthamology Supply) IMPLANT
LENS VITRECTOMY FLAT LF DISP (Opthamology Supply)
LENS VITRTM FLT DISP (Opthamology Supply)
NEEDLE ANES RETROBL 1.5X23G (Needles)
NEEDLE OPHTHALMIC BD VISITEC ATKINSON (Needles) IMPLANT
NEEDLE OPHTHALMIC BD VISITEC ATKINSON OD.6 MM L38 MM 22 D ANGLE POINT (Needles) IMPLANT
NEEDLE OPTH 22D VSTC ATKNSN .6MM 38MM LF (Needles)
PACK VITRECTOMY 25G (Opthamology Supply) ×3 IMPLANT
PACK VITRECTOMY GAS FILL SYRINGE (Opthamology Supply) IMPLANT
PACK VITRECTOMY GAS FILL SYRINGE CONSTELLATION ENGAUGE V-LOCITY (Opthamology Supply) IMPLANT
PACK VITRECTOMY PROBE FRAGMENTATION (Opthamology Supply) IMPLANT
PACK VITRECTOMY PROBE FRAGMENTATION HANDPIECE LIGHTWEIGHT (Opthamology Supply) IMPLANT
PACK VITRECTOMY SYRINGE NEEDLE CANNULA (Opthamology Supply) ×2 IMPLANT
PACK VITRECTOMY SYRINGE NEEDLE CANNULA CONSTELLATION ENGAUGE RUBBER (Opthamology Supply) IMPLANT
PACK VITRTM CNSTL 20GA LF PRB (Opthamology Supply)
PACK VITRTM RBR 10CC CNSTL ENGAUGE 20GA (Opthamology Supply) ×2
PENCIL 25GA BIPOLAR 25G (Cautery) ×1 IMPLANT
PROBE LASER ILLUM FLX CRVD 25G (Laser Supplies) ×1
PROBE LASER IRIDEX 25G (Laser Supplies) IMPLANT
PROBE LASER OD25 GA L2 IN ILLUMINATE (Laser Supplies) ×2 IMPLANT
PROBE LASER OD25 GA L2 IN ILLUMINATE CURVE TIP LARGE TACTILE INDICATOR (Laser Supplies) IMPLANT
PROBE LSR NTNL PLS GLS 78D PUREPOINT 25 (Laser Supplies) ×2
SCRAPER CURVED MEMBRANE 25+ GA (Procedure Accessories)
SCRAPER OPHTHALMIC CURVE OD25 GA (Procedure Accessories) IMPLANT
SCRAPER OPHTHALMIC CURVE OD25 GA FINESSE GRIESHABER (Procedure Accessories) IMPLANT
SCRAPER OPTH CRV 25GA (Procedure Accessories)
SUTURE ABS 8-0 TG140-8 VCL MICROPOINT 8 (Suture)
SUTURE COATED VICRYL 8-0 TG140-8 L8 IN 2 (Suture) IMPLANT
SUTURE COATED VICRYL 8-0 TG140-8 L8 IN 2 ARM BRAID COATED VIOLET (Suture) IMPLANT
SUTURE VICRYL 8-0 TG1408 18IN (Suture)
WATER STERILE PLASTIC POUR BOTTLE 250 ML (Irrigation Solutions) ×2 IMPLANT
WATER STRL  250ML LF PLS PR BTL (Irrigation Solutions) ×2
WATER STRL IRRIG 250ML BTL (Irrigation Solutions) ×1

## 2017-03-11 NOTE — Transfer of Care (Signed)
Anesthesia Transfer of Care Note    Patient: Katelyn Cortez    Procedures performed: Procedure(s) with comments:  VITRECTOMY, 25 GAUGE, MECHANICAL - VITRECTOMY, 25 GAUGE RD REPAIR-COMPLEX RIGHT EYE  EQUIP=25 GAUGE; MD REQ=75MINS; Q1=UNK  VITRECTOMY, LASER/SILICONE OIL  VITRECTOMY, MEMBRANE PEELING    Anesthesia type: MAC    Patient location:Phase II PACU    Last vitals:   Vitals:    03/11/17 1232   BP: 146/76   Pulse: 72   Resp: 14   Temp:    SpO2: 93%       Post pain: Patient not complaining of pain, continue current therapy      Mental Status:awake    Respiratory Function: tolerating room air    Cardiovascular: stable    Nausea/Vomiting: patient not complaining of nausea or vomiting    Hydration Status: adequate    Post assessment: no apparent anesthetic complications and no reportable events    Signed by: Skeet Simmer  03/11/17 12:39 PM

## 2017-03-11 NOTE — Brief Op Note (Signed)
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

## 2017-03-11 NOTE — Op Note (Addendum)
FULL OPERATIVE NOTE    Date Time: 03/11/17 12:06 PM  Patient Name: Katelyn Cortez  Attending Physician: Venita Lick, MD      Date of Operation:   03/11/2017    Providers Performing:   Surgeon: Venita Lick     Assistant(s): None    Operative Procedure:   Procedure(s):  VITRECTOMY, 25 GAUGE, MECHANICAL,  MEMBRANE PEELING, LASER/SILICONE OIL, RIGHT EYE      Preoperative Diagnosis:   Traction retinal detachment, right eye    Postoperative Diagnosis:   Traction retinal detachment, right eye    Indications:   Loss of vision    Operative Notes:   Informed consent was obtained prior the procedure. The procedure, risks, benefits, and  alternatives were discussed with the patient. In the preoperative holding area the operative eye was marked. The patient was then brought back to the operating room and placed under monitored anesthesia care.  The operative eye was then prepped and draped in the usual sterile fashion for ophthalmic surgery.    The microscope was brought into position. The patient  received a Sub-Tenon's block consisting of .75% Marcaine and 2% xylocaine through an inferonasal cutdown incision.  Standard 25 gauge instrumentation was used to create sclerotomies 3mm posterior to the limbus. The infusion cannula was verified prior to turning on the infusion.  Upon visualization with the light pipe, the traction retinal detachment was total with the macula off and with a macular hole.     A core vitrectomy was performed.  A posterior vitreous detachment was partially present with proliferative membranes. Vitrectomy was carried out 360 degrees with the aid of scleral depression.  Traction membranes were dissected and peeled using ILM and Max Grip forceps with the aid of indocyanine green stain.  Additional staining was used to then peel the internal limiting membrane around the macular hole and extended to the periphery with the ILM and Max Grip forceps.  Any bleeding was managed by raising the  intraocular pressure and diathermy. Diathermy was used to mark the retinal breaks. An air fluid exchange was then performed draining fluid through a posterior drainage retinotomy.  Endolaser was applied to the breaks after fluid air exchange as well as in the far periphery more densely inferior than remainder of the other 360 degrees.  The eye was then filled with silicone oil 1000 centistoke to a moderate tactile tension.     The cannulas were pulled and the wounds were closed with 6-0 plain gut suture. A subconjunctival injection of ancef and dexamethasone was given. The eye was patched over neomycin polymyxin dexamethasone ointment. The patient tolerated the procedure well and was returned to the recovery room in stable condition.  The patient received post operative instructions including follow up with [facility].          Estimated Blood Loss:   Less than 1 cc    Specimens:   None    Complications:   None      Signed by: Venita Lick, MD  03/11/17 12:06 PM

## 2017-03-11 NOTE — Discharge Instr - AVS First Page (Signed)
Reason for your Hospital Admission:  Repair of retinal detachment, right eye      Instructions for after your discharge:  Post Operative Instructions:    Please bring this instruction sheet, the eyedrops and the eye kit with you to the office.  Keep the patch overtop of the eye until you are examined tomorrow. Continue all other medications unless told otherwise.  Call the office if you have any questions or problems with the eye.    1. Follow up: You will see Dr. Josiah Lobo or his colleague tomorrow in the office as previously arranged.  2. Symptoms of Concern:  If the following symptoms develop, please contact the office immediately: worsening of eye pain, severe headache, vomiting, or decreasing vision.  3. Medications: BEGIN AFTER FIRST DRESSING CHANGE TOMORROW; WASH HANDS BEFORE INSTILLING DROPS        [x]  PolyTrim 4 x per day         [x]  Pred Forte 4 x per day             4.  Please take Tylenol 500 mg 2 tablets; or Ibuprofen 200 mg 2 tablets      every 4-6 hours as needed for pain.         5.   Positioning:         [x]  Face down for 3 days as much as possible during waking hrs.        [x]  No Positioning while awake (i.e. You may be upright) starting Monday        [x]  Sleep face down or on right side        [x]  Avoid face up postioning for 10 days    6. Arm Band: If you received a green arm band at the time of surgery,           please do not remove it until instructed by your physician.     7.  Activities:  Avoid strenuous activities: heavy lifting, straining, sudden       movement. You may watch TV or read. You may shower or shampoo       beginning in one day after surgery. Avoid getting water directly in the            eye. [] [facility]  [address]  [place], [place] [address]  [phone]  Fax: [phone]    [x] [facility]  [address] [place]  [place], [place] [address]  [phone]  Fax: [phone]    []  [place]  [address]  [facility] [address]  [place], [place]  [phone]        [] [place]  [address]  [place], [place] [address]  [phone]    [] [place]  [address]  [place], [place] [address]  [phone]    [] [place]  [address]  [place] [address]  [phone]      [] [place]  [address]  [place], [place] [address]  [phone]    [] [facility]  [address] [place] [address]  [phone]    [] [place]  [address]  [place], [place] [address]  [phone]    []  [place]  [address] [place], [place] [address]  [phone]    [] [place]  [address]  [place], [place] [address]  [phone]    [] [facility] Odor at [facility]  [address]  [place], [place] [address]  [phone]    '[]'$ [facility]  [address] [place]  [place], [place] [address]  [phone]

## 2017-03-11 NOTE — Discharge Instructions (Signed)

## 2017-03-11 NOTE — H&P (Signed)
ADMISSION HISTORY AND PHYSICAL EXAM    Date Time: 03/11/17 10:21 AM  Patient Name: Katelyn Cortez  Attending Physician: Venita Lick, MD    Assessment:     Patient Active Problem List    Diagnosis Date Noted   . Traction retinal detachment involving macula, right 03/11/2017       Plan:   Procedure(s):  VITRECTOMY, 25 GAUGE, MECHANICAL, right eye    History of Present Illness:   Katelyn Cortez is a 71 y.o. female who presents to the hospital with traction retinal detachment of  right eye.    Past Medical History:     Past Medical History:   Diagnosis Date   . Arthritis    . Heart murmur    . Hyperlipidemia    . Hypertensive disorder    . Hypothyroidism    . Type 2 diabetes mellitus     does not check blood glucose       Past Surgical History:     Past Surgical History:   Procedure Laterality Date   . ABDOMINAL SURGERY     . EGD, COLONOSCOPY N/A 09/09/2015    Procedure: EGD, COLONOSCOPY;  Surgeon: Guadalupe Dawn, MD;  Location: [facility];  Service: Gastroenterology;  Laterality: N/A;  EGD, COLONOSCOPY W/IVA   . TUBAL LIGATION  1978       Allergies:   No Known Allergies    Medications:     Prescriptions Prior to Admission   Medication Sig   . levothyroxine (SYNTHROID, LEVOTHROID) 125 MCG tablet Take 125 mcg by mouth daily.   . metFORMIN (GLUCOPHAGE) 850 MG tablet Take 850 mg by mouth 2 (two) times daily with meals.   . pramoxine (ANUSOL, PROCTOFOAM) 1 % foam Place rectally every 2 (two) hours as needed for Hemorrhoids.   . pravastatin (PRAVACHOL) 40 MG tablet Take 40 mg by mouth daily.   . valsartan-hydrochlorothiazide (DIOVAN-HCT) 320-12.5 MG per tablet Take 1 tablet by mouth daily.   . traMADol-acetaminophen (ULTRACET) 37.5-325 MG per tablet Take 1 tablet by mouth every 6 (six) hours as needed for Pain.       Physical Exam:     Vitals:    03/11/17 1003   BP: 191/79   Pulse:    Resp:    Temp:    SpO2:        Right eye: Visual Acuity CF - HM     Retinal findings: Total traction retinal detachment  with proliferative vitreoretinopathy, right eye    Neuro: Alert, Oriented, moves all extremities equally agree  Cardiac: Regular rate and rhythm, no murmur agree  Respiratory: Bilateral/equal breath sounds present agree    Labs:     Results     Procedure Component Value Units Date/Time    Glucose Whole Blood - POCT [[id number]]  (Abnormal) Collected:  03/11/17 0953     Updated:  03/11/17 0956     POCT - Glucose Whole blood 156 (H) mg/dL           CONSENT    Operative Site Verified: Right  Informed consent Obtained: Specific benefits, risks, and alternatives discussed with patient and/or next of kin or guardian:agree        Signed by: Venita Lick

## 2017-03-11 NOTE — Anesthesia Preprocedure Evaluation (Signed)
Anesthesia Evaluation    AIRWAY    Mallampati: I    TM distance: >3 FB  Neck ROM: full  Mouth Opening:full   CARDIOVASCULAR    cardiovascular exam normal       DENTAL           PULMONARY    pulmonary exam normal     OTHER FINDINGS              Relevant Problems   No relevant active problems               Anesthesia Plan    ASA 3     MAC               (Risk and benefits of anesthesia explained to patient. Questions answered, consent obtained.)      intravenous induction   Detailed anesthesia plan: general IV and MAC            informed consent obtained                   Signed by: Gaylyn Rong 03/11/17 9:38 AM

## 2017-03-11 NOTE — Anesthesia Postprocedure Evaluation (Signed)
Anesthesia Post Evaluation    Katelyn Cortez    Procedures performed: Procedure(s) with comments:  Procedure(s) with comments:  VITRECTOMY, 25 GAUGE, MECHANICAL - VITRECTOMY, 25 GAUGE RD REPAIR-COMPLEX RIGHT EYE  EQUIP=25 GAUGE; MD REQ=75MINS; Q1=UNK  VITRECTOMY, LASER/SILICONE OIL  VITRECTOMY, MEMBRANE PEELING      Anesthesia type: TIVA  Patient location:Phase II PACU  Vitals:    03/11/17 1232   BP: 146/76   Pulse: 72   Resp: 14   Temp:    SpO2: 93%           Post pain: Patient not complaining of pain, continue current therapy     Mental Status:awake    Respiratory Function: tolerating room air    Cardiovascular: stable    Nausea/Vomiting: patient not complaining of nausea or vomiting    Hydration Status: adequate    Post assessment: no apparent anesthetic complications

## 2017-03-14 ENCOUNTER — Encounter: Payer: Self-pay | Admitting: Ophthalmology

## 2017-08-08 IMAGING — MG MAMMO SCRN BIL W/CAD TOMO
6 of 9 series · 6 of 25 positions shown · non-contrast
Comparison: Comparison is made to exams dated:  08/06/2016, 07/22/2015, and 03/13/2013 [HOSPITAL] - [HOSPITAL].

Images Obtained from [HOSPITAL] Imaging
INDICATION: Screening mammogram.
TECHNIQUE: Bilateral 2 dimensional digital mammogram was performed, followed by bilateral 3 dimensional mammogram. Current study was also evaluated with a Computer Aided Detection (CAD) system.
BREAST COMPOSITION: The breasts are composed of scattered areas of fibroglandular density.

[L CC (1 of 2)]
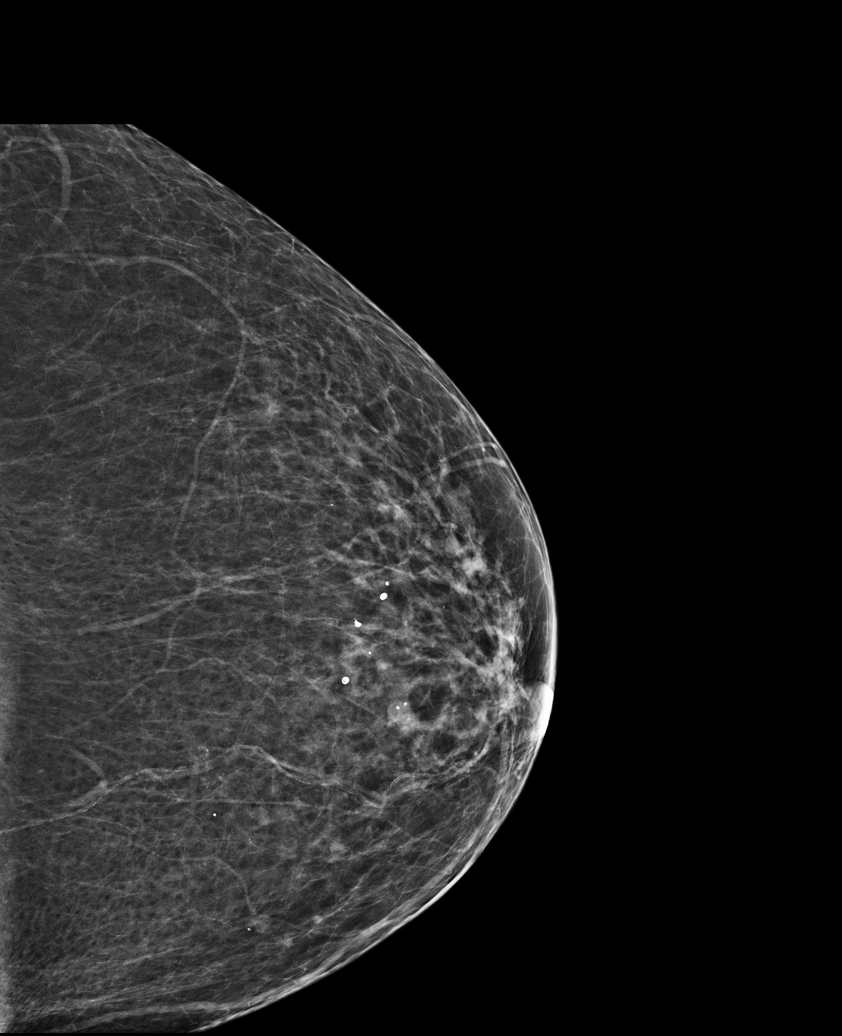

[R MLO]
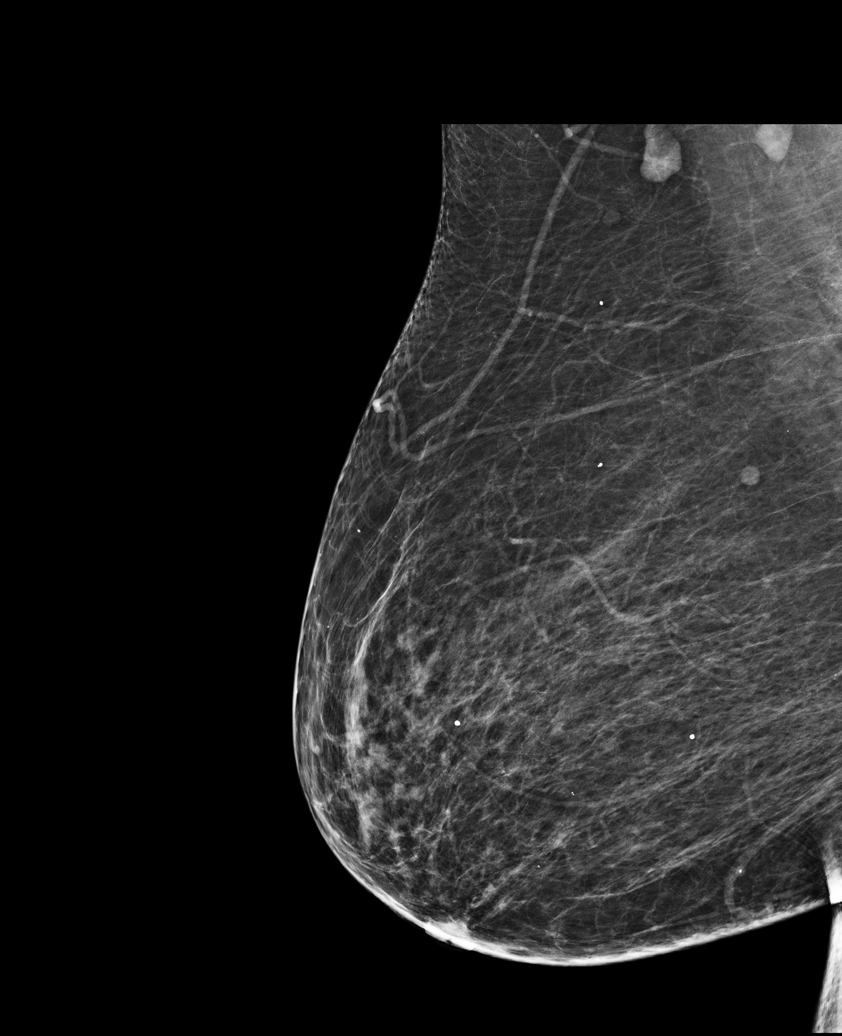

[R CC]
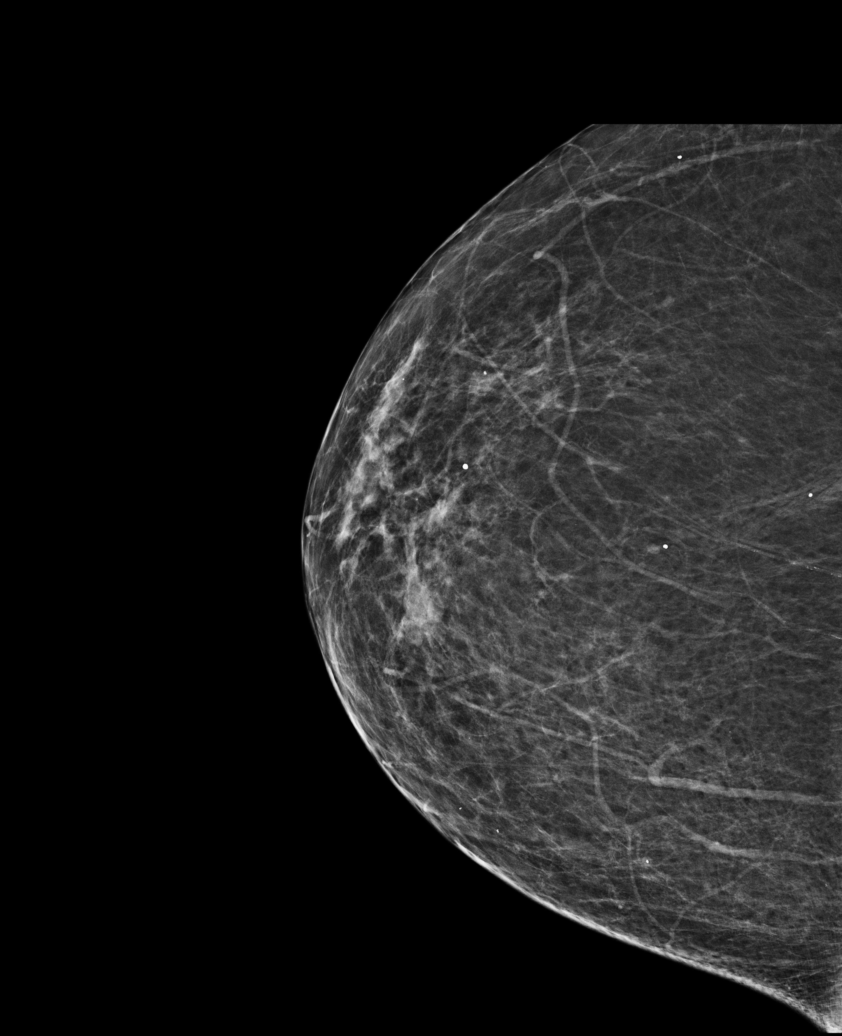

[L CC (2 of 2)]
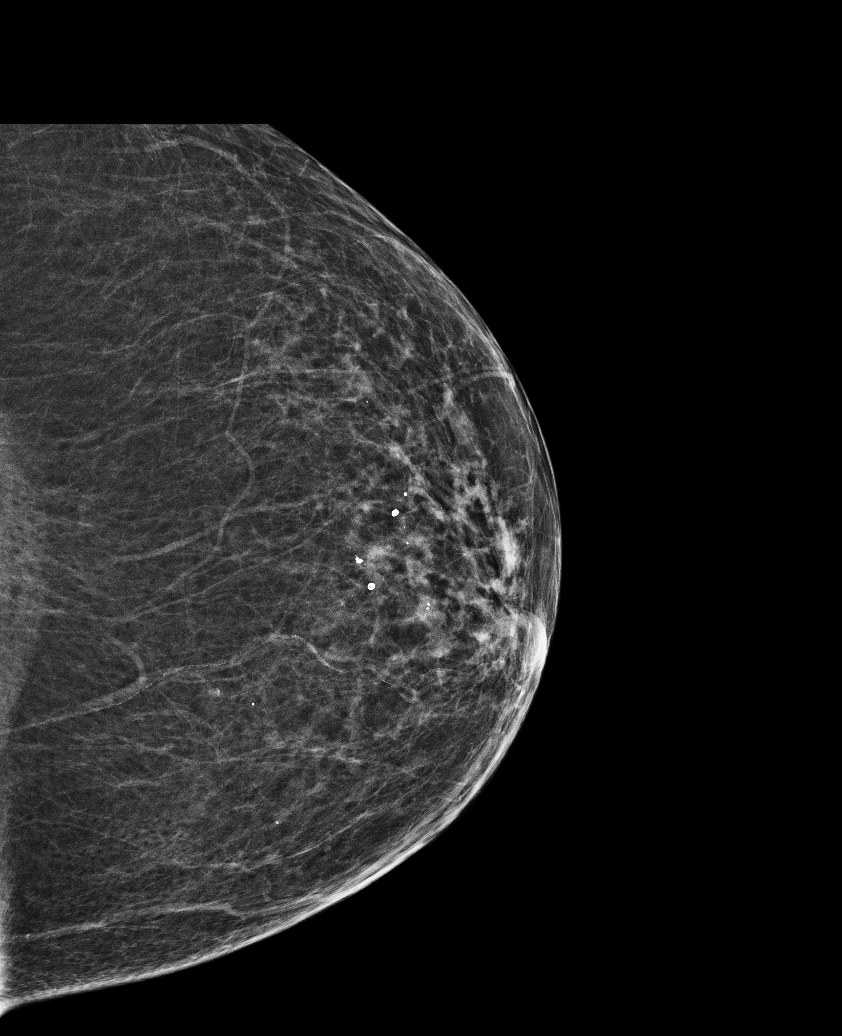

[L MLO]
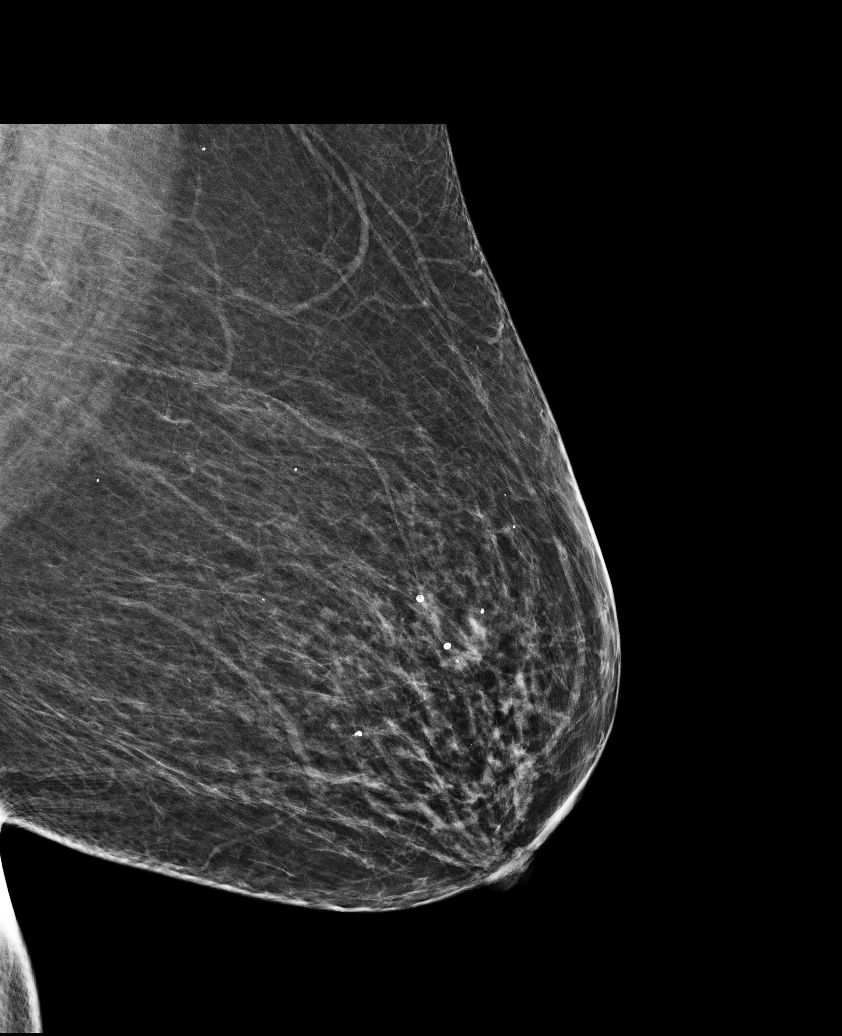

[L CC tomo · tomo slice 27/52.0]
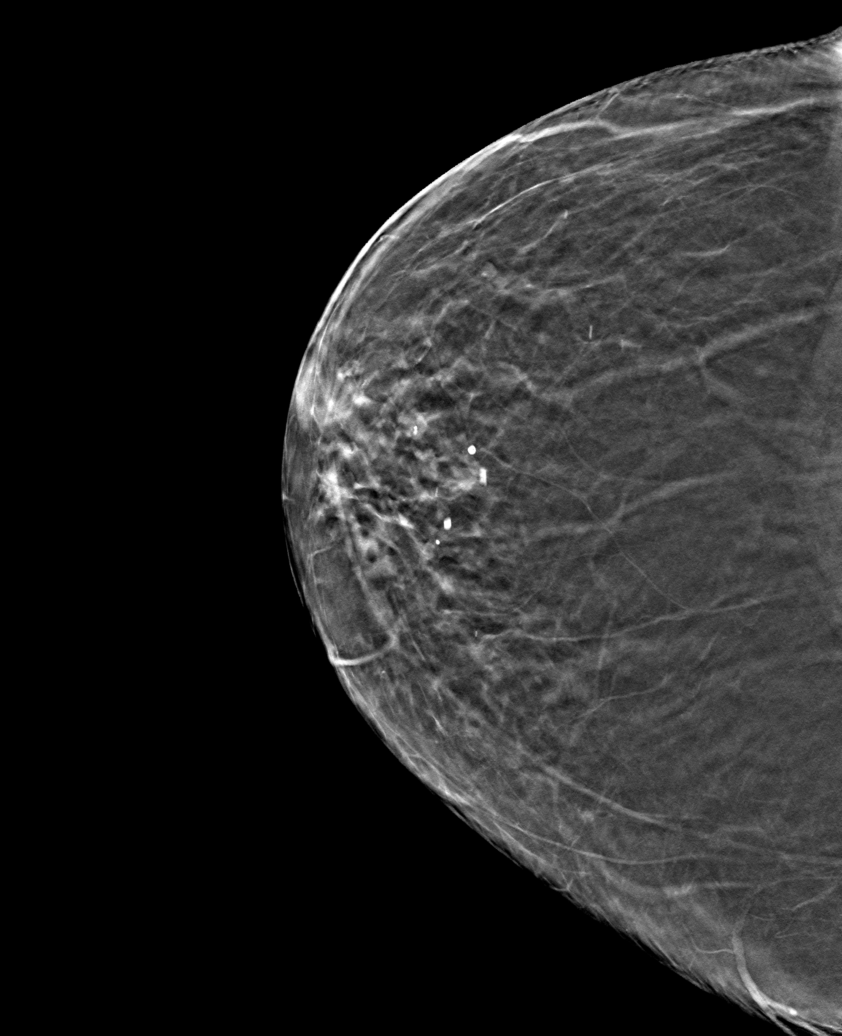

[6 of 25 positions shown; findings below may reference images not displayed]

FINDINGS: No significant masses, calcifications, or other findings are seen in either breast.
There has been no significant interval change.
IMPRESSION: There is no mammographic evidence of malignancy. A 1 year screening mammogram is recommended.
Soriano/Negasi:08/09/2017 [DATE]
letter sent: Normal
FINAL ASSESSMENT: BI-RADS:Category 1: Negative

## 2017-08-31 ENCOUNTER — Ambulatory Visit: Payer: Medicare Other | Attending: Ophthalmology

## 2017-08-31 NOTE — Pre-Procedure Instructions (Signed)
NO preop needed  Will fax surgeon for retina note.  Pre op med orders sent to Pharmacy.  No nursing orders to note.  Allergies reviewed

## 2017-09-02 ENCOUNTER — Encounter
Admission: RE | Disposition: A | Discharge status: Home / Self Care | Payer: Self-pay | Source: Ambulatory Visit | Attending: Ophthalmology

## 2017-09-02 ENCOUNTER — Ambulatory Visit: Payer: Medicare Other | Admitting: Anesthesiology

## 2017-09-02 ENCOUNTER — Ambulatory Visit
Admission: RE | Admit: 2017-09-02 | Discharge: 2017-09-02 | Disposition: A | Discharge status: Home / Self Care | Payer: Medicare Other | Source: Ambulatory Visit | Attending: Ophthalmology | Admitting: Ophthalmology

## 2017-09-02 ENCOUNTER — Encounter: Payer: Self-pay | Admitting: Ophthalmology

## 2017-09-02 DIAGNOSIS — E785 Hyperlipidemia, unspecified: Secondary | ICD-10-CM | POA: Insufficient documentation

## 2017-09-02 DIAGNOSIS — E039 Hypothyroidism, unspecified: Secondary | ICD-10-CM | POA: Insufficient documentation

## 2017-09-02 DIAGNOSIS — Z7984 Long term (current) use of oral hypoglycemic drugs: Secondary | ICD-10-CM | POA: Insufficient documentation

## 2017-09-02 DIAGNOSIS — E119 Type 2 diabetes mellitus without complications: Secondary | ICD-10-CM | POA: Insufficient documentation

## 2017-09-02 DIAGNOSIS — Z79899 Other long term (current) drug therapy: Secondary | ICD-10-CM | POA: Insufficient documentation

## 2017-09-02 DIAGNOSIS — H3341 Traction detachment of retina, right eye: Secondary | ICD-10-CM | POA: Insufficient documentation

## 2017-09-02 DIAGNOSIS — I10 Essential (primary) hypertension: Secondary | ICD-10-CM | POA: Insufficient documentation

## 2017-09-02 HISTORY — DX: Encounter for other specified aftercare: Z51.89

## 2017-09-02 HISTORY — DX: Anemia, unspecified: D64.9

## 2017-09-02 HISTORY — DX: Unspecified cataract: H26.9

## 2017-09-02 HISTORY — DX: Other visual disturbances: H53.8

## 2017-09-02 HISTORY — PX: VITRECTOMY, ENDO LASER/PRP: SHX5712

## 2017-09-02 HISTORY — DX: Liver disease, unspecified: K76.9

## 2017-09-02 HISTORY — DX: Rash and other nonspecific skin eruption: R21

## 2017-09-02 HISTORY — PX: VITRECTOMY, SILICONE OIL REMOVAL: SHX5721

## 2017-09-02 HISTORY — DX: Unspecified visual disturbance: H53.9

## 2017-09-02 HISTORY — PX: VITRECTOMY, 23 GAUGE, MECHANICAL, PARS PLANA: SHX5705

## 2017-09-02 LAB — GLUCOSE WHOLE BLOOD - POCT: Whole Blood Glucose POCT: 165 mg/dL — ABNORMAL HIGH (ref 70–100)

## 2017-09-02 SURGERY — VITRECTOMY 23 GAUGE, MECHANICAL, PARS PLANA
Anesthesia: Anesthesia MAC / Sedation | Site: Eye | Laterality: Right | Wound class: Clean

## 2017-09-02 MED ORDER — SODIUM CHLORIDE 0.9 % IV SOLN
INTRAVENOUS | Status: DC
Start: 2017-09-02 — End: 2017-09-02

## 2017-09-02 MED ORDER — STERILE WATER FOR IRRIGATION IR SOLN
Status: DC | PRN
Start: 2017-09-02 — End: 2017-09-02
  Administered 2017-09-02: 250 mL

## 2017-09-02 MED ORDER — LACTATED RINGERS IV SOLN
75.0000 mL/h | INTRAVENOUS | Status: DC
Start: 2017-09-02 — End: 2017-09-02

## 2017-09-02 MED ORDER — TETRACAINE HCL 0.5 % OP SOLN
OPHTHALMIC | Status: DC | PRN
Start: 2017-09-02 — End: 2017-09-02
  Administered 2017-09-02: 2 [drp] via OPHTHALMIC

## 2017-09-02 MED ORDER — LIDOCAINE HCL 2 % IJ SOLN
INTRAMUSCULAR | Status: DC | PRN
Start: 2017-09-02 — End: 2017-09-02
  Administered 2017-09-02: 2 mL

## 2017-09-02 MED ORDER — TETRACAINE HCL 0.5 % OP SOLN
OPHTHALMIC | Status: AC
Start: 2017-09-02 — End: ?
  Filled 2017-09-02: qty 4

## 2017-09-02 MED ORDER — FENTANYL CITRATE (PF) 50 MCG/ML IJ SOLN (WRAP)
INTRAMUSCULAR | Status: DC | PRN
Start: 2017-09-02 — End: 2017-09-02
  Administered 2017-09-02 (×2): 25 ug via INTRAVENOUS
  Administered 2017-09-02: 50 ug via INTRAVENOUS

## 2017-09-02 MED ORDER — HYDROMORPHONE HCL 0.5 MG/0.5 ML IJ SOLN
0.2500 mg | INTRAMUSCULAR | Status: DC | PRN
Start: 2017-09-02 — End: 2017-09-02

## 2017-09-02 MED ORDER — TROPICAMIDE 1 % OP SOLN
1.0000 [drp] | OPHTHALMIC | Status: AC
Start: 2017-09-02 — End: 2017-09-02
  Administered 2017-09-02 (×3): 1 [drp] via OPHTHALMIC

## 2017-09-02 MED ORDER — POLYMYXIN B-TRIMETHOPRIM 10000-0.1 UNIT/ML-% OP SOLN
1.00 [drp] | Freq: Four times a day (QID) | OPHTHALMIC | 1 refills | Status: AC
Start: 2017-09-02 — End: 2017-09-09

## 2017-09-02 MED ORDER — NEOMYCIN-POLYMYXIN-DEXAMETH 3.5-10000-0.1 OP OINT
TOPICAL_OINTMENT | OPHTHALMIC | Status: DC | PRN
Start: 2017-09-02 — End: 2017-09-02
  Administered 2017-09-02: 1 g via OPHTHALMIC

## 2017-09-02 MED ORDER — PREDNISOLONE ACETATE 1 % OP SUSP
1.00 [drp] | Freq: Four times a day (QID) | OPHTHALMIC | 1 refills | Status: AC
Start: 2017-09-02 — End: 2017-09-16

## 2017-09-02 MED ORDER — BSS IO SOLN
INTRAOCULAR | Status: DC | PRN
Start: 2017-09-02 — End: 2017-09-02
  Administered 2017-09-02: 20 mL via TOPICAL

## 2017-09-02 MED ORDER — PHENYLEPHRINE HCL 2.5 % OP SOLN
1.0000 [drp] | OPHTHALMIC | Status: AC
Start: 2017-09-02 — End: 2017-09-02
  Administered 2017-09-02 (×3): 1 [drp] via OPHTHALMIC

## 2017-09-02 MED ORDER — DEXAMETHASONE SOD PHOSPHATE PF 10 MG/ML IJ SOLN
INTRAMUSCULAR | Status: DC | PRN
Start: 2017-09-02 — End: 2017-09-02
  Administered 2017-09-02: 5 mg via INTRAVENOUS

## 2017-09-02 MED ORDER — LACTATED RINGERS IV SOLN
INTRAVENOUS | Status: DC
Start: 2017-09-02 — End: 2017-09-02

## 2017-09-02 MED ORDER — OXYCODONE-ACETAMINOPHEN 5-325 MG PO TABS
1.0000 | ORAL_TABLET | Freq: Once | ORAL | Status: DC | PRN
Start: 2017-09-02 — End: 2017-09-02

## 2017-09-02 MED ORDER — BALANCED SALT IO SOLN
INTRAOCULAR | Status: DC | PRN
Start: 2017-09-02 — End: 2017-09-02
  Administered 2017-09-02: 500 mL via INTRAOCULAR

## 2017-09-02 MED ORDER — PHENYLEPHRINE HCL 2.5 % OP SOLN
OPHTHALMIC | Status: AC
Start: 2017-09-02 — End: ?
  Filled 2017-09-02: qty 1

## 2017-09-02 MED ORDER — ONDANSETRON HCL 4 MG/2ML IJ SOLN
4.0000 mg | Freq: Once | INTRAMUSCULAR | Status: DC | PRN
Start: 2017-09-02 — End: 2017-09-02

## 2017-09-02 MED ORDER — TROPICAMIDE 1 % OP SOLN
OPHTHALMIC | Status: AC
Start: 2017-09-02 — End: ?
  Filled 2017-09-02: qty 15

## 2017-09-02 MED ORDER — CEFAZOLIN SODIUM 1 G IJ SOLR
INTRAMUSCULAR | Status: DC | PRN
Start: 2017-09-02 — End: 2017-09-02
  Administered 2017-09-02: 50 mg via SUBCONJUNCTIVAL

## 2017-09-02 MED ORDER — HYPROMELLOSE (GONIOSCOPIC) 2.5 % OP SOLN
OPHTHALMIC | Status: DC | PRN
Start: 2017-09-02 — End: 2017-09-02
  Administered 2017-09-02: 2 mL via TOPICAL

## 2017-09-02 MED ORDER — FENTANYL CITRATE (PF) 50 MCG/ML IJ SOLN (WRAP)
INTRAMUSCULAR | Status: AC
Start: 2017-09-02 — End: ?
  Filled 2017-09-02: qty 2

## 2017-09-02 MED ORDER — BUPIVACAINE HCL (PF) 0.75 % IJ SOLN
INTRAMUSCULAR | Status: DC | PRN
Start: 2017-09-02 — End: 2017-09-02
  Administered 2017-09-02: 2 mL

## 2017-09-02 MED ORDER — TETRACAINE HCL 0.5 % OP SOLN
1.0000 [drp] | Freq: Once | OPHTHALMIC | Status: AC
Start: 2017-09-02 — End: 2017-09-02
  Administered 2017-09-02: 08:00:00 1 [drp] via OPHTHALMIC

## 2017-09-02 MED ORDER — FENTANYL CITRATE (PF) 50 MCG/ML IJ SOLN (WRAP)
25.0000 ug | INTRAMUSCULAR | Status: DC | PRN
Start: 2017-09-02 — End: 2017-09-02

## 2017-09-02 SURGICAL SUPPLY — 59 items
CANNULA DUAL BORE 25G .5MM (Opthamology Supply)
CANNULA OPHTHALMIC 25GA FLEXTIP 2 BORE (Opthamology Supply) IMPLANT
CANNULA OPHTHALMIC SHORT SOFT TIP OD25 (Cannula) ×1 IMPLANT
CANNULA OPHTHALMIC SHORT SOFT TIP OD25 GA ODSEC.8 MM GRIESHABER (Cannula) IMPLANT
CANNULA OPTH FLXTP 25GA 2 BORE (Opthamology Supply) IMPLANT
CANNULA OPTH SHRT GRSHBR 25GA .8MM STRL (Cannula) ×2
CANNULA SOFT TIP 25G .8MM (Cannula) ×1
CAUTERY 25GA (Cautery) ×2 IMPLANT
CONSTELLATION AUTO GAS FILL PK (Opthamology Supply)
CONSTELLATION FRAGMATOM ACC PK (Opthamology Supply)
CONSTELLATION VFC PK (Opthamology Supply) ×1
DRESSING TEGADERM 4X4X3/4IN (Dressing) ×1
DRESSING TRANSPARENT L4 3/4 IN X W4 IN (Dressing) ×1 IMPLANT
DRESSING TRANSPARENT L4 3/4 IN X W4 IN POLYURETHANE ADHESIVE (Dressing) ×1 IMPLANT
DRESSING TRNS PU STD TGDRM 4.75X4IN LF (Dressing) ×2
FORCEP ILM 23G (Procedure Accessories) ×1
FORCEPS OPHTHALMIC INTERNAL LIMIT (Procedure Accessories) ×1 IMPLANT
FORCEPS OPHTHALMIC INTERNAL LIMIT MEMBRANE OD23 GA GRIESHABER (Procedure Accessories) ×1 IMPLANT
FORCEPS OPTH GRSH RV 23GA STRL INTNL LMT (Procedure Accessories) ×2
GOWN SMART IMPERVIOUS LARGE (Gown) ×3 IMPLANT
LABEL MED LF STRL PK CSTM RTNA DISP (Other) ×2
LABEL MEDICAL PACK CUSTOM RETINA (Other) ×1 IMPLANT
LABEL MEDICAL PACK CUSTOM RETINA CL22746 CUSTOM MED LABEL PACK (Other) ×1 IMPLANT
LABEL SHEET CUSTOM RETINA (Other) ×1
LANCE V FULL HNDL 20G (Blade) IMPLANT
LENS VITRECTOMY FLAT (Opthamology Supply) IMPLANT
LENS VITRECTOMY FLAT DISPOSABLE VITRECTOMY LENS: FLAT (Opthamology Supply) IMPLANT
LENS VITRECTOMY FLAT LF DISP (Opthamology Supply)
LENS VITRTM FLT DISP (Opthamology Supply)
NEEDLE ANES RETROBL 1.5X23G (Needles)
NEEDLE OPHTHALMIC BD VISITEC ATKINSON (Needles) IMPLANT
NEEDLE OPHTHALMIC BD VISITEC ATKINSON OD.6 MM L38 MM 22 D ANGLE POINT (Needles) IMPLANT
NEEDLE OPTH 22D VSTC ATKNSN .6MM 38MM LF (Needles)
PACK VITRECTOMY 23G (Opthamology Supply) ×3 IMPLANT
PACK VITRECTOMY GAS FILL SYRINGE (Opthamology Supply) IMPLANT
PACK VITRECTOMY GAS FILL SYRINGE CONSTELLATION ENGAUGE V-LOCITY (Opthamology Supply) IMPLANT
PACK VITRECTOMY PROBE FRAGMENTATION (Opthamology Supply) IMPLANT
PACK VITRECTOMY PROBE FRAGMENTATION HANDPIECE LIGHTWEIGHT (Opthamology Supply) IMPLANT
PACK VITRECTOMY SYRINGE NEEDLE CANNULA (Opthamology Supply) ×1 IMPLANT
PACK VITRECTOMY SYRINGE NEEDLE CANNULA CONSTELLATION ENGAUGE RUBBER (Opthamology Supply) IMPLANT
PACK VITRTM CNSTL 20GA LF PRB (Opthamology Supply)
PACK VITRTM RBR 10CC CNSTL ENGAUGE 20GA (Opthamology Supply) ×2
PROBE FLEXIBLE TIP LASER 23GA (Instrument) ×2 IMPLANT
PROBE LASER DIRECTIONAL  23G (Procedure Accessories)
PROBE OPHTHALMIC 0-45 D FINGER (Procedure Accessories) IMPLANT
PROBE OPHTHALMIC 0-45 D FINGER ADJUSTMENT OD23 GA ENDOPROBE (Procedure Accessories) IMPLANT
PROBE OPTH 0-45D EPRB 23GA FNGR ADJ (Procedure Accessories)
SCRAPER CURVED MEMBRANE 25+ GA (Procedure Accessories)
SCRAPER OPHTHALMIC CURVE OD25 GA (Procedure Accessories) IMPLANT
SCRAPER OPHTHALMIC CURVE OD25 GA FINESSE GRIESHABER (Procedure Accessories) IMPLANT
SCRAPER OPTH CRV 25GA (Procedure Accessories)
SUTURE ABS 8-0 TG140-8 VCL MICROPOINT 8 (Suture)
SUTURE COATED VICRYL 8-0 TG140-8 L8 IN 2 (Suture) IMPLANT
SUTURE COATED VICRYL 8-0 TG140-8 L8 IN 2 ARM BRAID COATED VIOLET (Suture) IMPLANT
SUTURE VICRYL 8-0 TG1408 18IN (Suture)
WATER STERILE PLASTIC POUR BOTTLE 250 ML (Irrigation Solutions) ×1 IMPLANT
WATER STRL  250ML LF PLS PR BTL (Irrigation Solutions) ×1
WATER STRL 250ML LF PLS PR BTL (Irrigation Solutions) ×1
WATER STRL IRRIG 250ML BTL (Irrigation Solutions) ×1

## 2017-09-02 NOTE — Discharge Instr - AVS First Page (Addendum)
Reason for your Hospital Admission:  Surgery for removal of silicone oil, right eye      Instructions for after your discharge:  Post Operative Instructions:    Please bring this instruction sheet, the eyedrops and the eye kit with you to the office.  Keep the patch overtop of the eye until you are examined tomorrow. Continue all other medications unless told otherwise.  Call the office if you have any questions or problems with the eye.    1. Follow up: You will see Dr. Josiah Lobo or his colleague tomorrow in the office as previously arranged.  2. Symptoms of Concern:  If the following symptoms develop, please contact the office immediately: worsening of eye pain, severe headache, vomiting, or decreasing vision.  3. Medications: BEGIN AFTER FIRST DRESSING CHANGE TOMORROW; WASH HANDS BEFORE INSTILLING DROPS        [x]  PolyTrim 4 x per day         [x]  Pred Forte 4 x per day             4.  Please take Tylenol 500 mg 2 tablets; or Ibuprofen 200 mg 2 tablets      every 4-6 hours as needed for pain.         5.   Positioning:         [x]  No Positioning while awake (i.e. You may be upright)        [x]  Sleep on either side        [x]  Avoid airplane travel for 30 days         [x]  Avoid face up positioning for 30 days    6. Arm Band: If you received a green arm band at the time of surgery,           please do not remove it until instructed by your physician.     7.  Activities:  Avoid strenuous activities: heavy lifting, straining, sudden       movement. You may watch TV or read. You may shower or shampoo       beginning in one day after surgery. Avoid getting water directly in the            eye. [] [facility]  [address]  [place], [place] [address]  [phone]  Fax: [phone]    [x] [facility]  [address] [place]  [place], [place] [address]  [phone]  Fax: [phone]    []  [place]  [address]  [facility] [address]  [place], [place] [address]        [] [place]  [address]  [place], [place]  [address]  [phone]    [] [place]  [address]  [place], [place] [address]  [phone]    [] [place]  [address]  [place] [address]  [phone]      [] [place]  [address]  [place], [place] [address]  [phone]    [] [facility]  [address] [place] [place] [address]  [phone]    [] [place]  [address]  [place], [place] [address]  [phone]    []  [place]  [address] [place], [place] [address]  [phone]    [] [place]  [address]  [place], [place] [address]  [phone]    [] [place] Odor at [facility] 1  [address]  [place], [place] [address]  [phone]    []   [facility]  [address]  [place], [place] [address]  [phone]                    Post Anesthesia Discharge Instructions    Although you may be awake and alert in the recovery room, small amounts of anesthetic remain in your system for about 24 hours.  You may feel tired and sleepy during this time.      You are advised to go directly home from the hospital.    Plan to stay at home and rest for the remainder of the day.    It is advisable to have someone with you at home for 24 hours after surgery.    Do not operate a motor vehicle, or any mechanical or electrical equipment for the next 24 hours.      Be careful when you are walking around, you may become dizzy.  The effects of anesthesia and/or medications are still present and drowsiness may occur    Do not consume alcohol, tranquilizers, sleeping medications, or any other non prescribed medication for the remainder of the day.    Diet:  begin with liquids, progress your diet as tolerated or as directed by your surgeon.  Nausea and vomiting may occur in the next 24 hours.

## 2017-09-02 NOTE — Transfer of Care (Signed)
Pt awake, breathing spontaneously, VSS.  Report given to RN.

## 2017-09-02 NOTE — Brief Op Note (Signed)
BRIEF OP NOTE    Date Time: 09/02/17 10:02 AM    Patient Name:   Katelyn Cortez    Date of Operation:   09/02/2017    Providers Performing:   Surgeon(s): [employer] K Tyller Bowlby     Assistant(s): None    Operative Procedure:   VITRECTOMY, 23 GAUGE, MECHANICAL, SILICONE OIL REMOVAL, ENDO LASER, AIR FLUID EXCHANGE, RIGHT EYE    Preoperative Diagnosis:   Pre-Op Diagnosis Codes:     * TRD (traction retinal detachment), right [H33.41]    Postoperative Diagnosis:   Same    Anesthesia:   MAC with Sub-Tenon's block consisting of 1:1 mixture 0.75% Marcaine and 2% lidocaine    Estimated Blood Loss:   Less than 1 cc    Implants:   * No implants in log *      Findings:   Retina attached under oil, significant emulsification of oil.     Complications:   None       Signed by: Venita Lick, MD

## 2017-09-02 NOTE — H&P (Signed)
ADMISSION HISTORY AND PHYSICAL EXAM    Date Time: 09/02/17 8:46 AM  Patient Name: Katelyn Cortez  Attending Physician: Venita Lick, MD    Assessment:     Patient Active Problem List    Diagnosis Date Noted   . 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 female who presents to the hospital with silicone oil tamponade after retinal detachment repair of  right eye.    Past Medical History:     Past Medical History:   Diagnosis Date   . Abnormal vision    . Anemia     PMH   . Arthritis     knees   . Bilateral cataracts    . Blurred vision    . Encounter for blood transfusion     many years ago with anemia   . Heart murmur    . Hyperlipidemia     controlled with med   . Hypertensive disorder     controlled with med   . Hypothyroidism     controlled with med   . Liver disease     told it gets swollen    . Rash     heat rash? on back   . Type 2 diabetes mellitus 6-66yr    does not check blood glucose       Past Surgical History:     Past Surgical History:   Procedure Laterality Date   . ABDOMINAL SURGERY      hysteroscopy   . EGD, COLONOSCOPY N/A 09/09/2015    Procedure: EGD, COLONOSCOPY;  Surgeon: Guadalupe Dawn, MD;  Location: [facility] SUR[facility] OR;  Service: Gastroenterology;  Laterality: N/A;  EGD, COLONOSCOPY W/IVA   . EYE SURGERY Bilateral     cataracts   . TUBAL LIGATION  1978   . VITRECTOMY, 25 GAUGE, MECHANICAL Right 03/11/2017    Procedure: VITRECTOMY, 25 GAUGE, MECHANICAL;  Surgeon: Venita Lick, MD;  Location: [facility] MA[facility] OR;  Service: Ophthalmology;  Laterality: Right;  VITRECTOMY, 25 GAUGE RD REPAIR-COMPLEX RIGHT EYE  EQUIP=25 GAUGE; MD REQ=75MINS; Q1=UNK   . VITRECTOMY, LASER/SILICONE OIL Right 03/11/2017    Procedure: VITRECTOMY, LASER/SILICONE OIL;  Surgeon: Venita Lick, MD;  Location: [facility];  Service: Ophthalmology;  Laterality: Right;   . VITRECTOMY, MEMBRANE PEELING  03/11/2017    Procedure: VITRECTOMY, MEMBRANE PEELING;  Surgeon: Venita Lick, MD;  Location: [facility] OR;  Service: Ophthalmology;;       Allergies:   No Known Allergies    Medications:     Prescriptions Prior to Admission   Medication Sig   . atorvastatin (LIPITOR) 80 MG tablet TAKE 1 TABLET BY MOUTH EVERY DAY   . levothyroxine (SYNTHROID, LEVOTHROID) 125 MCG tablet Take 125 mcg by mouth daily.   . metFORMIN (GLUCOPHAGE) 850 MG tablet Take 850 mg by mouth every morning with breakfast.       . valsartan-hydrochlorothiazide (DIOVAN-HCT) 320-12.5 MG per tablet Take 1 tablet by mouth daily.   . pramoxine (ANUSOL, PROCTOFOAM) 1 % foam Place rectally every 2 (two) hours as needed for Hemorrhoids.   . pravastatin (PRAVACHOL) 40 MG tablet Take 40 mg by mouth daily.   . traMADol-acetaminophen (ULTRACET) 37.5-325 MG per tablet Take 1 tablet by mouth every  6 (six) hours as needed for Pain.       Physical Exam:     Vitals:    09/02/17 0823   BP: (!) 206/87   Pulse: 92   Resp: 16   Temp: 97.2 F (36.2 C)   SpO2: 95%       right Eye: Visual Acuity 20/400      Retinal findings: retina attached under silicone oil tamponade    Neuro: Alert, Oriented, moves all extremities equally agree  Cardiac: Regular rate and rhythm, no murmur agree  Respiratory: Bilateral/equal breath sounds present agree    Labs:     Results     Procedure Component Value Units Date/Time    Glucose Whole Blood - POCT [[id number]]  (Abnormal) Collected:  09/02/17 4098     Updated:  09/02/17 0826     POCT - Glucose Whole blood 165 (H) mg/dL           CONSENT    Operative Site Verified: Right  Informed consent Obtained: Specific benefits, risks, and alternatives discussed with patient and/or next of kin or guardian:agree        Signed by: Venita Lick

## 2017-09-02 NOTE — Op Note (Signed)
FULL OPERATIVE NOTE    Date Time: 09/02/17 10:02 AM  Patient Name: Katelyn Cortez  Attending Physician: Venita Lick, MD      Date of Operation:   09/02/2017    Providers Performing:   Surgeon: Venita Lick   Assistant(s): None    Operative Procedure:   VITRECTOMY, 23 GAUGE, MECHANICAL, SILICONE OIL REMOVAL, ENDO LASER, AIR FLUID EXCHANGE, RIGHT EYE    Preoperative Diagnosis:      * TRD (traction retinal detachment), right [H33.41]    Postoperative Diagnosis:      * TRD (traction retinal detachment), right [H33.41]      Indications:   Removal of implanted silicone oil after prior retinal detachment repair    Operative Notes:   Informed consent was obtained prior the procedure. The procedure, risks, benefits, and  alternatives were discussed with the patient. In the preoperative holding area the operative eye was marked. The patient was then brought back to the operating room and placed under monitored anesthesia care.  The operative eye was then prepped and draped in the usual sterile fashion for ophthalmic surgery.    The microscope was brought into position. The patient received a Sub-Tenon's block consisting of .75% Marcaine and 2% xylocaine through an inferonasal cutdown incision.  Standard 23 gauge instrumentation was used to create sclerotomies 3mm posterior to the limbus. The infusion cannula was verified prior to turning on the infusion.  Upon visualization with the light pipe, the retina was flat under silicone oil.    The oil was first removed using the silicone oil extrusion tubing.  Multiple air-fluid and fluid-air exchanges were completed to remove any residual silicone oil.  Additional trim of the peripheral hyaloid was performed with the vitreous cutter 360 degrees.    The retina was inspected 360 degrees with scleral depression and found to have no new tears or holes.  The endolaser probe was used to place a laser barricade along the previously treated retinal breaks and 360 degrees.   An air fluid exchange was performed for a complete fill to a moderate tactile tension.     The cannulas were pulled and the wounds were sutured with 6-0 plain gut suture. A subconjunctival injection of ancef and dexamethasone was given. The eye was patched over neomycin polymyxin dexamethasone ointment. The patient tolerated the procedure well and was returned to the recovery room in stable condition.  The patient received post operative instructions including follow up with [facility].          Estimated Blood Loss:   Less than 1 cc    Specimens:   None    Complications:   None    Signed by: Venita Lick, MD  09/02/17 10:02 AM

## 2017-09-02 NOTE — Anesthesia Preprocedure Evaluation (Addendum)
Anesthesia Evaluation    AIRWAY    Mallampati: III    TM distance: >3 FB  Neck ROM: full  Mouth Opening:full   CARDIOVASCULAR    regular and normal       DENTAL        (+) lower dentures and upper dentures   PULMONARY    clear to auscultation     OTHER FINDINGS              Relevant Problems   No relevant active problems               Anesthesia Plan    ASA 3     MAC               (Risks discussed including but not limited to neurological complications such as stroke, cardiovascular complications such as heart attack, pulmonary complications such as asthmatic attack, intra-operative awareness, death, dental trauma, and allergic reaction. Questions answered. Pt understands and wishes to proceed.    Dwight Burdo Phuc Thuc Sandip Power, DO  09/02/2017)      Detailed anesthesia plan: MAC  Monitors/Adjuncts: other    Post Op: other  Post op pain management: per surgeon        Plan discussed with CRNA.                   Signed by: Ursula Alert 09/02/17 6:53 AM

## 2017-09-02 NOTE — Anesthesia Postprocedure Evaluation (Signed)
Anesthesia Post Evaluation    Patient: Katelyn Cortez    Procedures performed: Procedure(s):  VITRECTOMY, 23 GAUGE, MECHANICAL  VITRECTOMY, SILICONE OIL REMOVAL  VITRECTOMY, ENDO LASER/PRP    Anesthesia type: MAC    Patient location:Phase II PACU    Last vitals:   Vitals:    09/02/17 1040   BP:    Pulse: 91   Resp: 20   Temp:    SpO2: 93%       Post pain: Patient not complaining of pain, continue current therapy     Mental Status:awake    Respiratory Function: tolerating face mask    Cardiovascular: stable    Nausea/Vomiting: patient not complaining of nausea or vomiting    Hydration Status: adequate    Post assessment: no apparent anesthetic complications, no reportable events

## 2017-09-04 NOTE — Addendum Note (Signed)
Addendum  created 09/04/17 1314 by Thomasenia Sales, CRNA    Anesthesia Event edited

## 2017-09-05 ENCOUNTER — Encounter: Payer: Self-pay | Admitting: Ophthalmology

## 2017-12-05 ENCOUNTER — Ambulatory Visit (INDEPENDENT_AMBULATORY_CARE_PROVIDER_SITE_OTHER): Payer: Medicare Other | Admitting: Family Medicine

## 2017-12-14 ENCOUNTER — Ambulatory Visit (INDEPENDENT_AMBULATORY_CARE_PROVIDER_SITE_OTHER): Payer: 59 | Admitting: Family Medicine

## 2017-12-14 ENCOUNTER — Encounter (INDEPENDENT_AMBULATORY_CARE_PROVIDER_SITE_OTHER): Payer: Self-pay | Admitting: Family Medicine

## 2017-12-14 VITALS — BP 149/71 | HR 86 | Temp 97.8°F | Ht <= 58 in | Wt 169.0 lb

## 2017-12-14 DIAGNOSIS — E034 Atrophy of thyroid (acquired): Secondary | ICD-10-CM

## 2017-12-14 DIAGNOSIS — E7800 Pure hypercholesterolemia, unspecified: Secondary | ICD-10-CM | POA: Insufficient documentation

## 2017-12-14 DIAGNOSIS — R739 Hyperglycemia, unspecified: Secondary | ICD-10-CM

## 2017-12-14 DIAGNOSIS — E119 Type 2 diabetes mellitus without complications: Secondary | ICD-10-CM | POA: Insufficient documentation

## 2017-12-14 DIAGNOSIS — Z23 Encounter for immunization: Secondary | ICD-10-CM

## 2017-12-14 DIAGNOSIS — E039 Hypothyroidism, unspecified: Secondary | ICD-10-CM | POA: Insufficient documentation

## 2017-12-14 NOTE — Progress Notes (Signed)
Chief Complaint   Patient presents with   . Establish Care   . Muscle Pain     all over body x3 days      72 y.o.  female presents complaining of    Patient is new to me.  Changed her mind midway about wanting to talk about muscle aches.     Patient is here with several years of prediabetes. IMG records show type 2 diabetes. Records release needed to clarify. Taking metformin.  Not checking her sugars daily.     Patient is here with several years of hypothyroidism. Taking her medication daily. No side effects. Denies signs of poor control.     Patient is here with several years of high cholesterol.  Reports taking her medication daily, following a low fat/chol diet and minimal exercise.     PROBLEM LIST:  Body mass index is 35.94 kg/m.  Past Medical History:   Diagnosis Date   . Abnormal vision    . Anemia     PMH   . Arthritis     knees   . Bilateral cataracts    . Blurred vision    . Encounter for blood transfusion     many years ago with anemia   . Heart murmur    . Hyperlipidemia     controlled with med   . Hypertensive disorder     controlled with med   . Hypothyroidism     controlled with med   . Liver disease     told it gets swollen    . Rash     heat rash? on back   . Type 2 diabetes mellitus 6-76yr    does not check blood glucose     Patient Active Problem List    Diagnosis Date Noted   . Type 2 diabetes mellitus 12/14/2017        . Hypercholesteremia 12/14/2017        . Hypothyroidism 12/14/2017          Current Outpatient Prescriptions   Medication Sig Dispense Refill   . atorvastatin (LIPITOR) 80 MG tablet TAKE 1 TABLET BY MOUTH EVERY DAY  5   . levothyroxine (SYNTHROID, LEVOTHROID) 125 MCG tablet Take 125 mcg by mouth daily.     . metFORMIN (GLUCOPHAGE) 850 MG tablet Take 850 mg by mouth every morning with breakfast.         . valsartan-hydrochlorothiazide (DIOVAN-HCT) 320-12.5 MG per tablet Take 1 tablet by mouth daily.       No current facility-administered medications for this visit.        SOCIAL  HISTORY:   Social History     Social History   . Marital status: Single     Spouse name: N/A   . Number of children: N/A   . Years of education: N/A     Social History Main Topics   . Smoking status: Never Smoker   . Smokeless tobacco: Never Used   . Alcohol use No   . Drug use: No   . Sexual activity: Not Asked     Other Topics Concern   . None     Social History Narrative   . None       No Known Allergies     PSH:    Abdominal surgery, colonoscopy and EGD, tubal ligation, vitrectomy numerous times,      FH:    Mom and dad with no medical diagnoses      ROS  Review of Systems   HENT: Positive for postnasal drip and rhinorrhea.    Respiratory: Negative for cough.    Cardiovascular: Negative for chest pain.   Musculoskeletal: Positive for myalgias.   Neurological: Negative for headaches.        EXAM  BP 149/71 (BP Site: Left arm, Patient Position: Sitting)   Pulse 86   Temp 97.8 F (36.6 C) (Oral)   Ht 1.461 m (4' 9.5")   Wt 76.7 kg (169 lb)   BMI 35.94 kg/m       Physical Exam  Constitutional:  Well developed, well nourished, no acute distress, non-toxic appearance , AAOx3. cooperative and well groomed.   Musculoskeletal:   moving all extremities, gait wnl,   Neurologic:  Alert & oriented x 3, no focal deficits noted   Psychiatric:  Speech and behavior appropriate     ASSESSMENT/PLAN:  1. Elevated blood sugar level  Unclear if diabetes or prediabetes. Will get records. Will also determine schedule of bw.     2. Need for vaccination    - Pneumococcal conjugate vaccine 13-valent less than 5yo IM    3. Hypercholesteremia  Unclear control and when labs were done. Will get records.     4. Hypothyroidism due to acquired atrophy of thyroid  Unclear control. Will get records.       Symptomatic treatment reviewed in detail  Call if symptoms persist, worsen, or change.  Call with updates/questions/concerns.      Return in about 1 week (around 12/21/2017) for myalgias.

## 2017-12-14 NOTE — Patient Instructions (Signed)
Diet: Diabetes  Food is an important tool that you can use to control diabetes and stay healthy. Eating well-balanced meals in the correct amounts will help you control your blood glucose levels and prevent low blood sugar reactions. It will also help you reduce the health risks of diabetes. There is no one specific diet that is right for everyone with diabetes. But there are general guidelines to follow. A registered dietitian (RD) will create a tailored diet approach that's just right for you. He or she will also help you plan healthy meals and snacks. If you have any questions, call your dietitian for advice.    Guidelines for success  Talk with your healthcare provider before starting a diabetes diet or weight loss program. If you haven't talked with a dietitian yet, ask your provider for a referral. The following guidelines can help you succeed:   Select foods from the 6 food groups below. Your dietitian will help you find food choices within each group. He or she will also show you serving sizes and how many servings you can have at each meal.  ? Grains, beans, and starchy vegetables  ? Vegetables  ? Fruit  ? Milk or yogurt  ? Meat, poultry, fish, or tofu  ? Healthy fats   Check your blood sugar levels as directed by your provider. Take any medicine as prescribed by your provider.   Learn to read food labels and pick the right portion sizes.   Eat only the amount of food in your meal plan. Eat about the same amount of food at regular times each day. Don't skip meals. Eat meals 4 to 5 hours apart, with snacks in between.   Limit alcohol. It raises blood sugar levels. Drink water or calorie-free diet drinks that use safe sweeteners.   Eat less fat to help lower your risk of heart disease. Use nonfat or low-fat dairy products and lean meats. Avoid fried foods. Use cooking oils that are unsaturated, such as olive, canola, or peanut oil.   Talk with your dietitian about safe sugar substitutes.   Avoid  added salt. It can contribute to high blood pressure, which can cause heart disease. People with diabetes already have a risk of high blood pressure and heart disease.   Stay at a healthy weight. If you need to lose weight, cut down on your portion sizes. But don't skip meals. Exercise is an important part of any weight management program. Talk with your provider about an exercise program that's right for you.   For more information about the best diet plan for you, talk with a registered dietitian (RD). To find an RD in your area, contact:  ? Academy of Nutrition and Dietetics www.eatright.org  ? The American Diabetes Association 800-342-2383 www.diabetes.org  Date Last Reviewed: 04/28/2015   2000-2018 [employer] [address], [place] [address]. All rights reserved. This information is not intended as a substitute for professional medical care. Always follow your healthcare professional's instructions.

## 2018-03-21 IMAGING — US US ABDOMEN WALL LTD
1 series · 15 of 25 positions shown · non-contrast
Comparison: none

INDICATION: Abdominal wall mass, history of prior hernia repair.

[Series 1: us abdomen wall ltd · 15 of 27 slices shown]
[im 1/27]
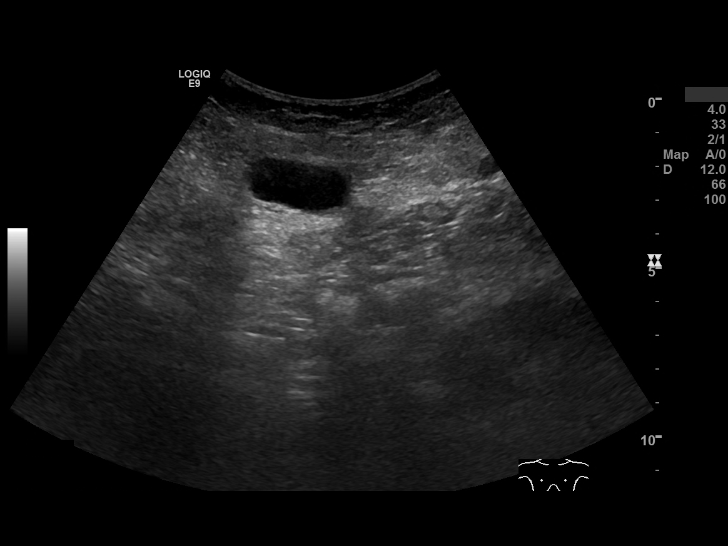
[im 3/27]
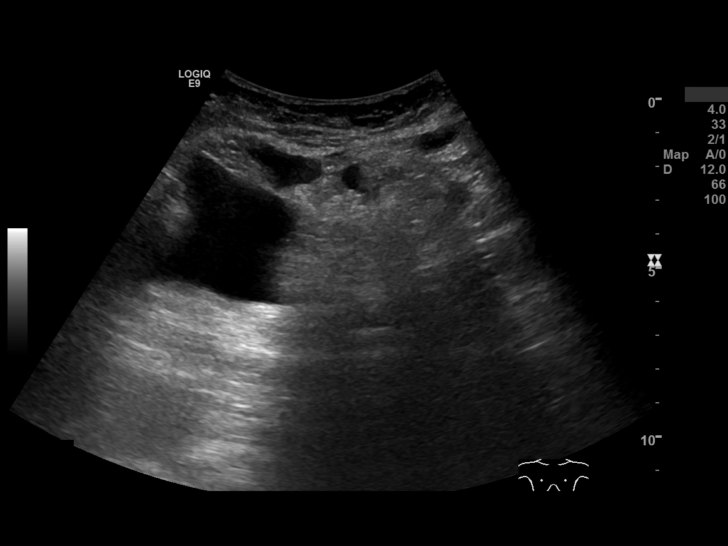
[im 5/27]
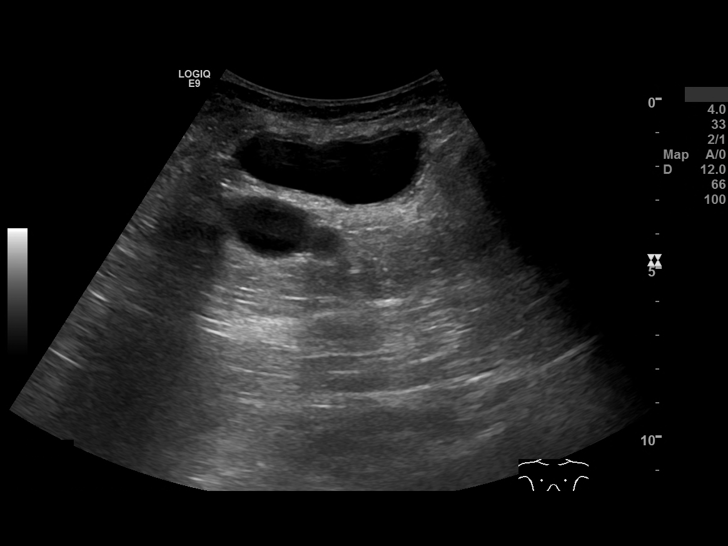
[im 6/27]
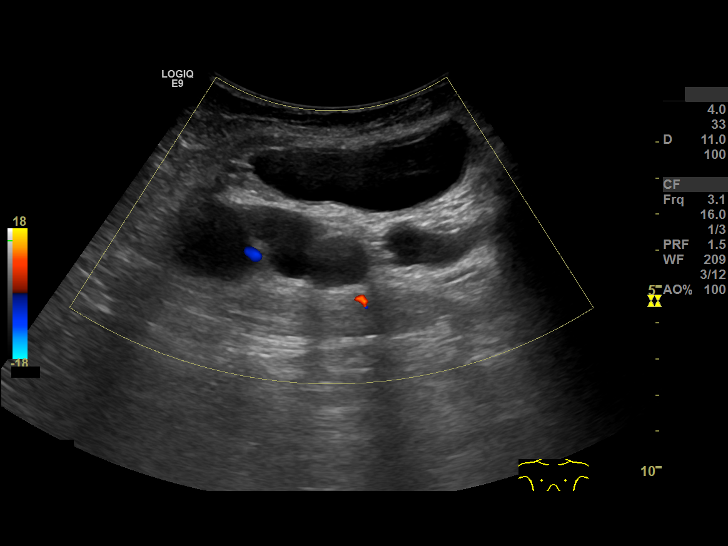
[im 8/27]
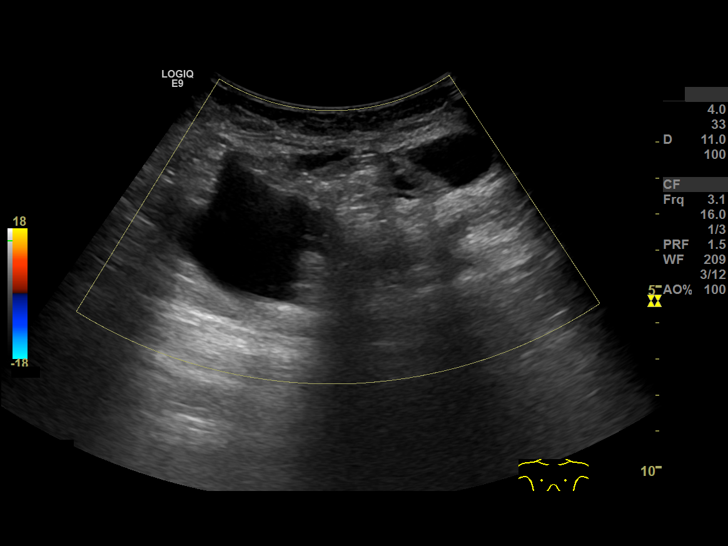
[im 10/27]
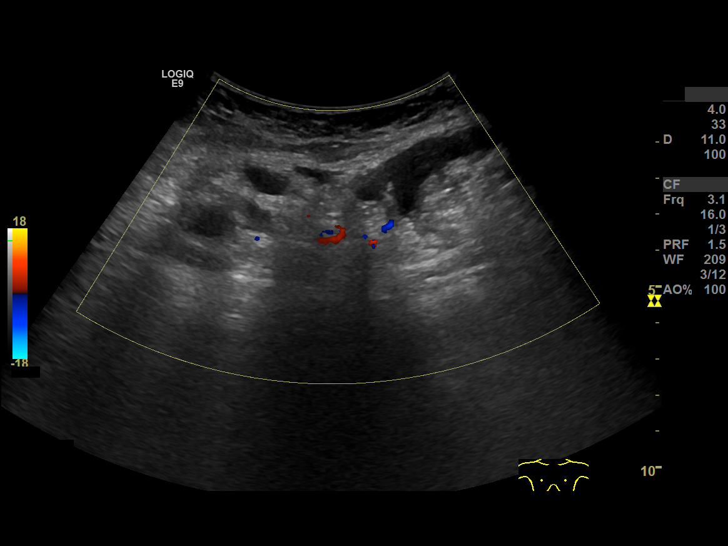
[im 11/27]
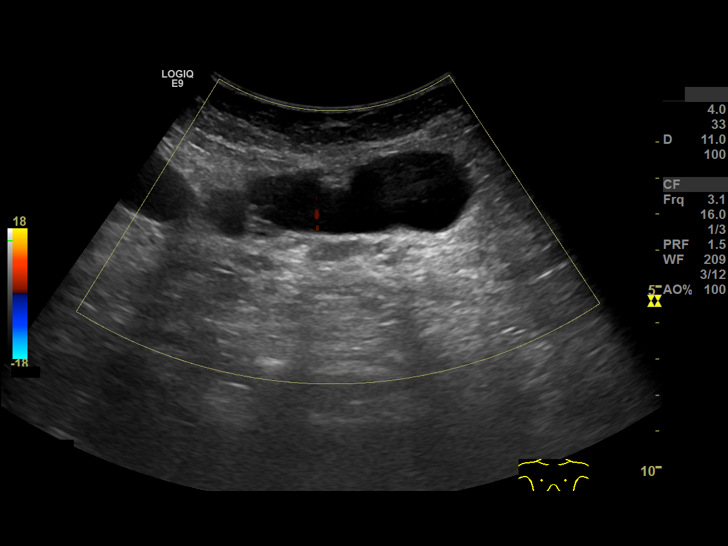
[im 14/27]
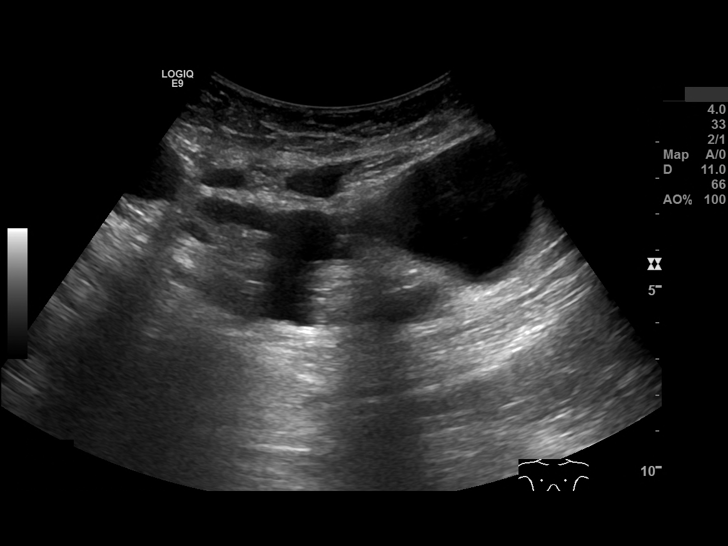
[im 16/27]
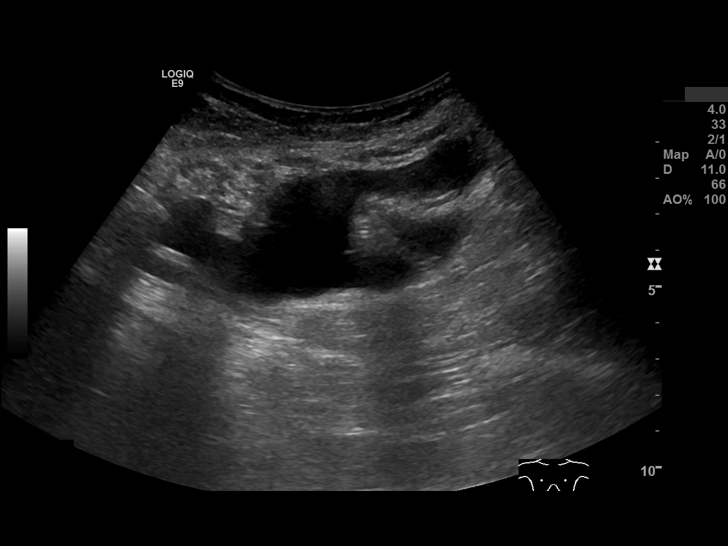
[im 17/27]
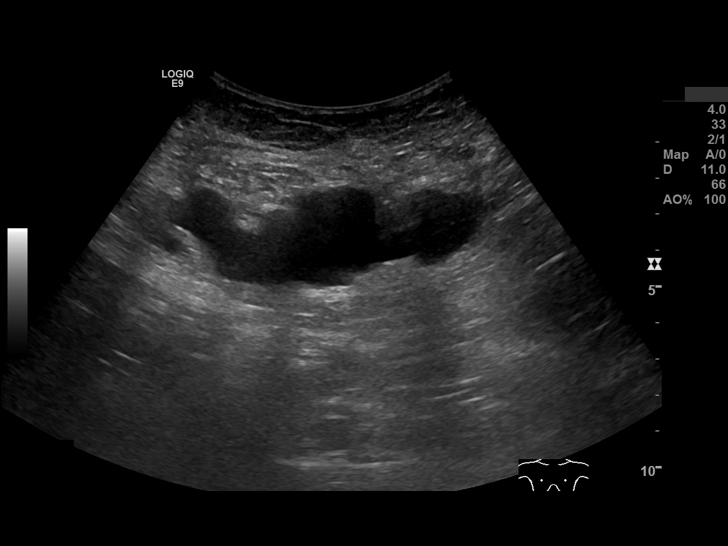
[im 19/27]
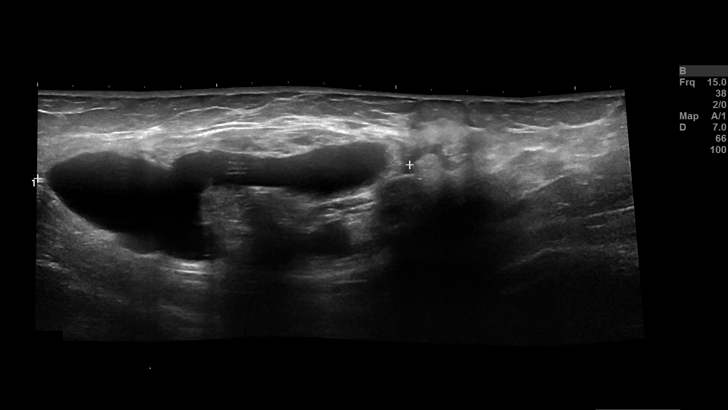
[im 21/27]
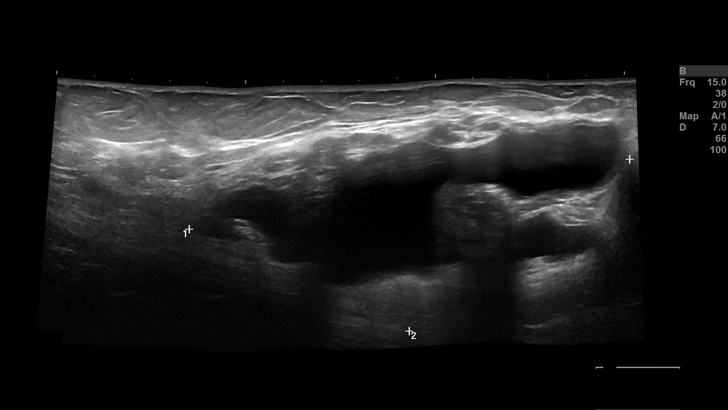
[im 22/27]
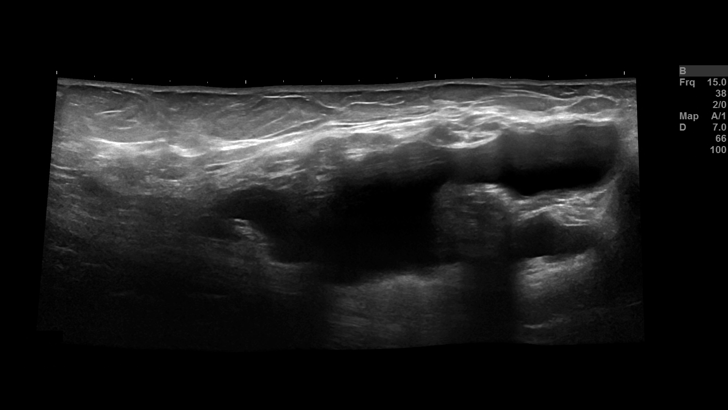
[im 24/27]
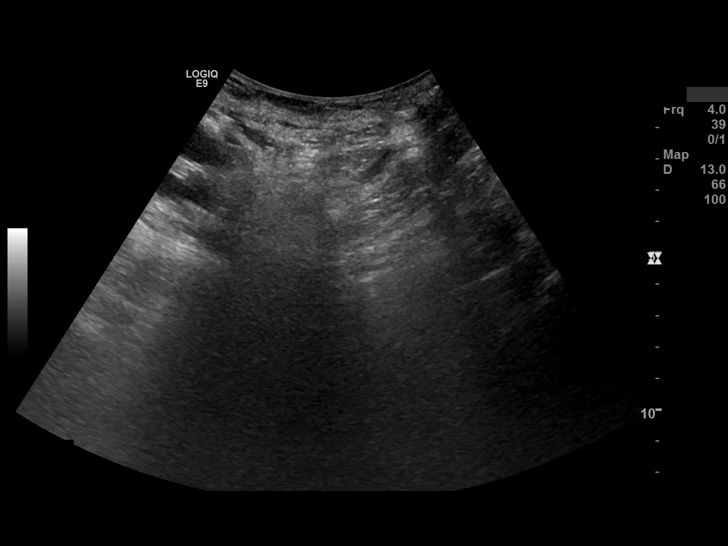
[im 27/27]
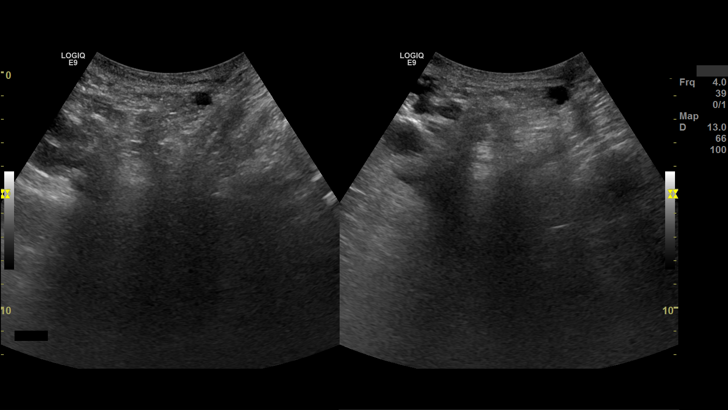

[15 of 25 positions shown; findings below may reference images not displayed]

FINDINGS: Targeted ultrasound examination of the anterior abdominal wall near the umbilicus shows a complex fluid collection measuring approximately 12 x 5 x 10 cm. The integrity of the associated fascia is not well delineated by ultrasound. Given the patient's surgical history, this may reflect a complex seroma or potentially a fluid containing recurrent hernia. Enhanced CT would provide more thorough delineation.
IMPRESSION: Complex fluid collection in the anterior abdominal wall, likely seroma or recurrent hernia.

## 2018-06-29 ENCOUNTER — Ambulatory Visit
Admission: RE | Admit: 2018-06-29 | Discharge: 2018-06-29 | Disposition: A | Discharge status: Home / Self Care | Payer: 59 | Source: Ambulatory Visit | Attending: Family Medicine | Admitting: Family Medicine

## 2018-06-29 ENCOUNTER — Other Ambulatory Visit: Payer: Self-pay | Admitting: Family Medicine

## 2018-06-29 DIAGNOSIS — M25512 Pain in left shoulder: Secondary | ICD-10-CM

## 2018-06-29 DIAGNOSIS — M5440 Lumbago with sciatica, unspecified side: Secondary | ICD-10-CM

## 2018-06-29 DIAGNOSIS — M25552 Pain in left hip: Secondary | ICD-10-CM

## 2018-06-29 DIAGNOSIS — R05 Cough: Secondary | ICD-10-CM | POA: Insufficient documentation

## 2018-06-29 DIAGNOSIS — M545 Low back pain, unspecified: Secondary | ICD-10-CM

## 2018-06-29 DIAGNOSIS — R059 Cough, unspecified: Secondary | ICD-10-CM

## 2018-07-01 ENCOUNTER — Ambulatory Visit
Admission: RE | Admit: 2018-07-01 | Discharge: 2018-07-01 | Disposition: A | Discharge status: Home / Self Care | Payer: 59 | Source: Ambulatory Visit | Attending: Family Medicine | Admitting: Family Medicine

## 2018-07-01 ENCOUNTER — Ambulatory Visit: Admission: RE | Admit: 2018-07-01 | Payer: 59 | Source: Ambulatory Visit

## 2018-07-01 ENCOUNTER — Other Ambulatory Visit: Payer: Self-pay | Admitting: Family Medicine

## 2018-07-01 DIAGNOSIS — M549 Dorsalgia, unspecified: Secondary | ICD-10-CM

## 2018-07-01 DIAGNOSIS — M25511 Pain in right shoulder: Secondary | ICD-10-CM | POA: Insufficient documentation

## 2019-07-09 IMAGING — MR MRI LSPINE WO CONTRAST
7 series · 34 of 48 positions shown · IV contrast (agent unspecified)
Comparison: Lumbar spine radiographs from same day

HISTORY: 73 year old Female with low back pain and right leg pain.
TECHNIQUE: Multiplanar, multisequence MR images of the lumbar spine were obtained without intravenous contrast.

CONTRAST: None.

[Series 10: iii_aaspine_lspine_mpr_cor · coronal · 1.7mm · 1.67mm/px · 9 of 80 slices shown]
[im 5/80]
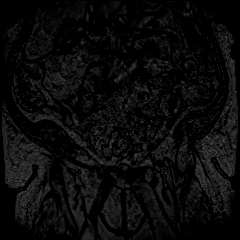
[im 13/80]
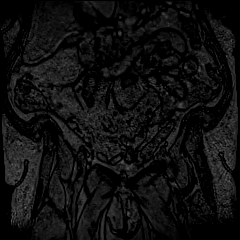
[im 25/80]
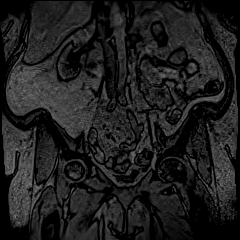
[im 34/80]
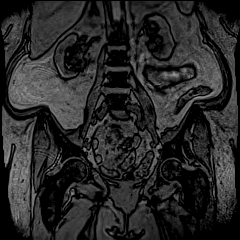
[im 42/80]
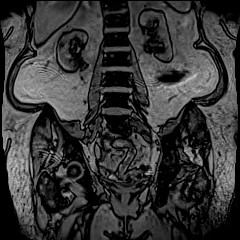
[im 46/80]
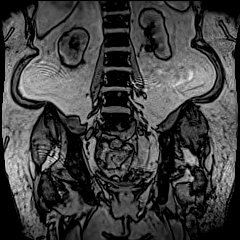
[im 55/80]
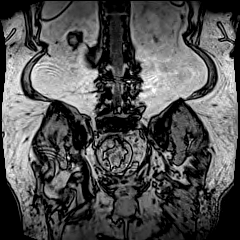
[im 67/80]
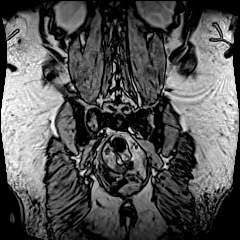
[im 75/80]
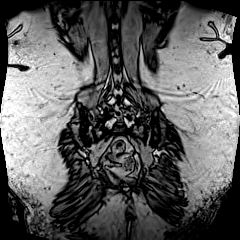

[Series 17: t2_sag · sagittal · 4.0mm · 0.68mm/px · 3 of 15 slices shown]
[im 1/15]
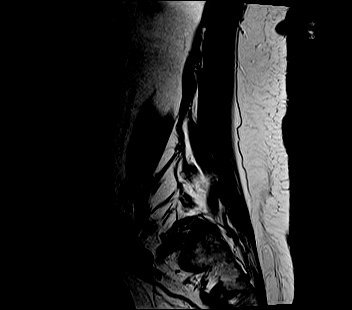
[im 8/15]
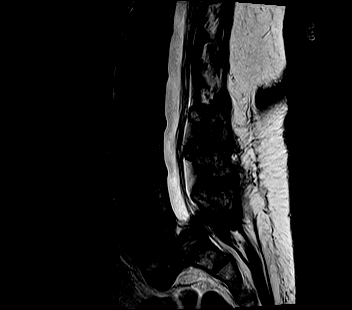
[im 15/15]
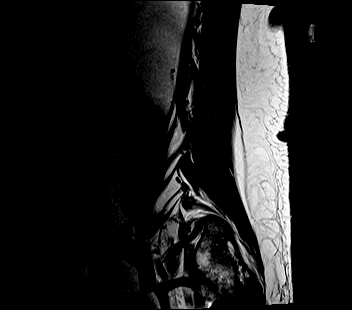

[Series 18: t1_sag · sagittal · 4.0mm · 0.75mm/px · 3 of 15 slices shown]
[im 1/15]
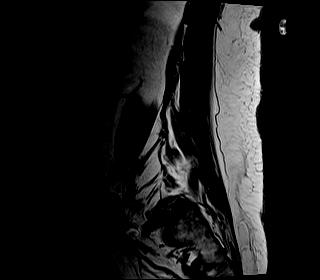
[im 8/15]
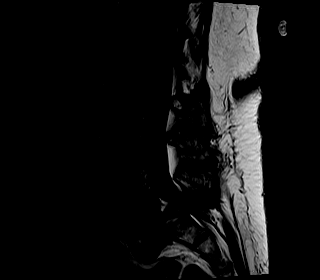
[im 15/15]
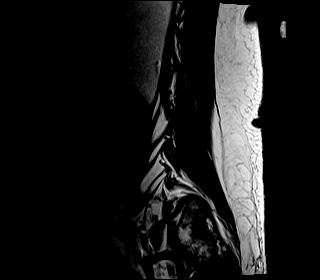

[Series 19: ir_sag · sagittal · 4.0mm · 0.42mm/px · 3 of 15 slices shown]
[im 1/15]
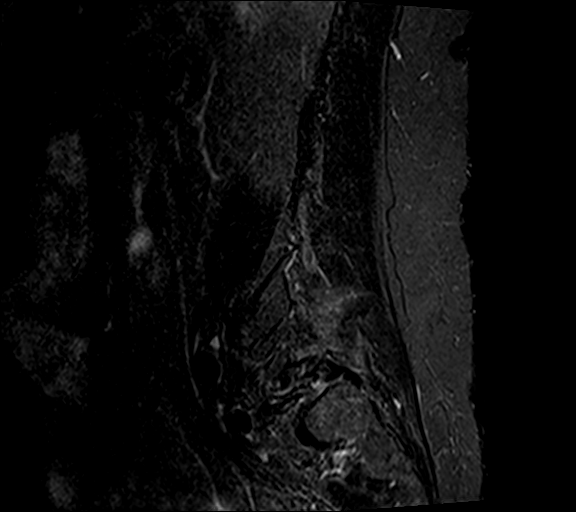
[im 8/15]
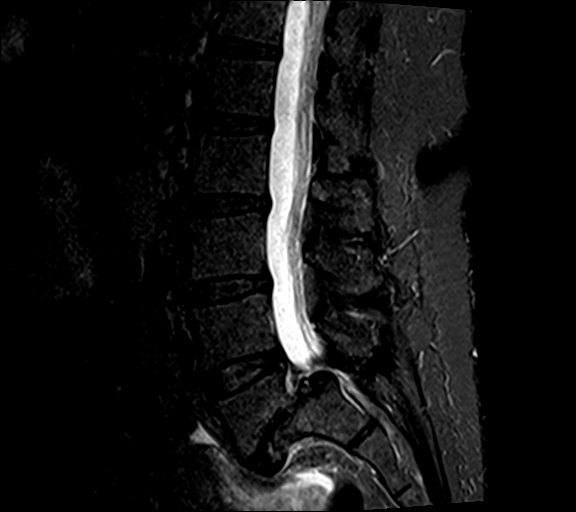
[im 15/15]
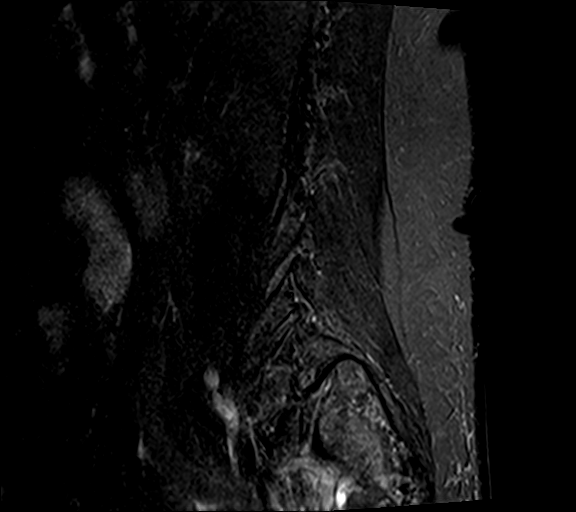

[Series 20: t2_axial · axial · 4.0mm · 0.62mm/px · z∈[-459,-253]mm · 8 of 44 slices shown]
[im 1/44]
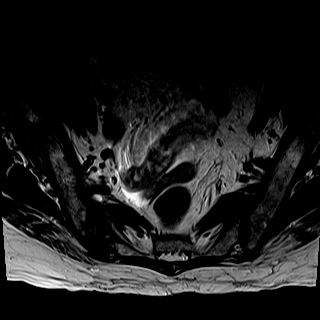
[im 5/44]
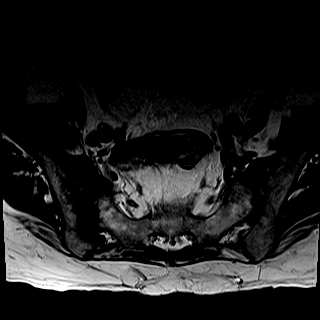
[im 15/44]
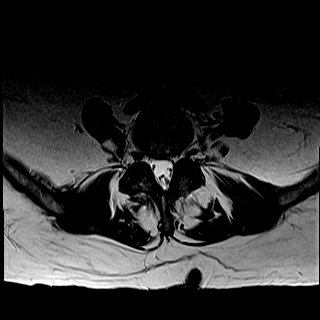
[im 20/44]
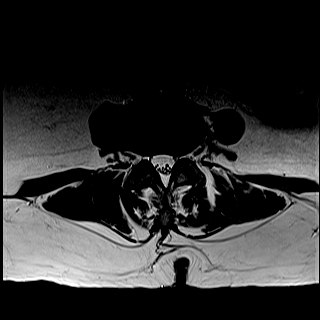
[im 24/44]
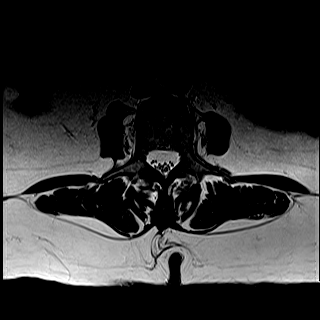
[im 29/44]
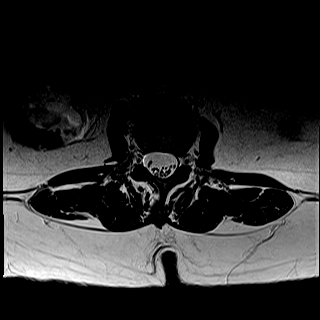
[im 39/44]
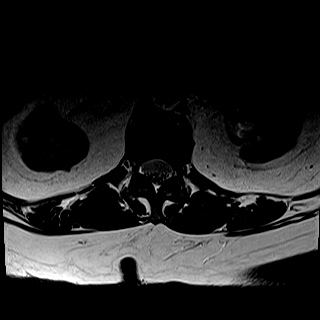
[im 44/44]
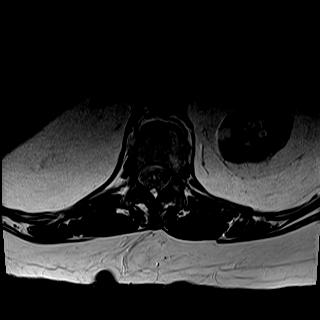

[Series 21: t1_axial_obl · oblique · 3.0mm · 0.43mm/px · 6 of 26 slices shown]
[im 1/26]
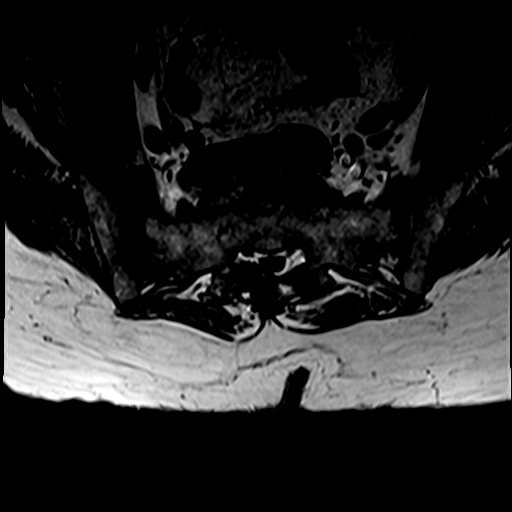
[im 6/26]
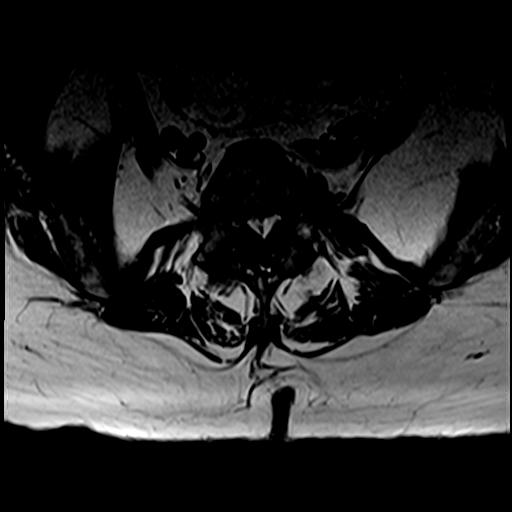
[im 11/26]
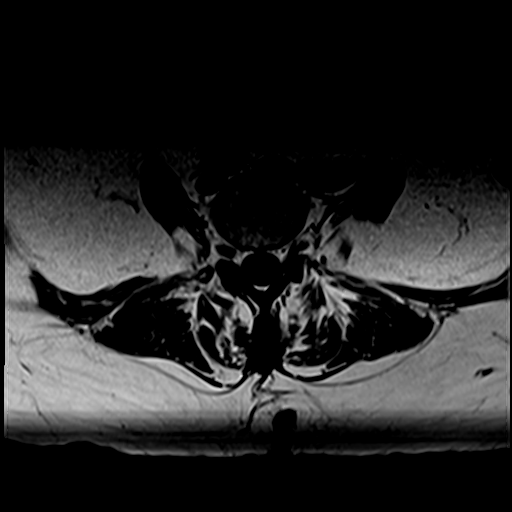
[im 16/26]
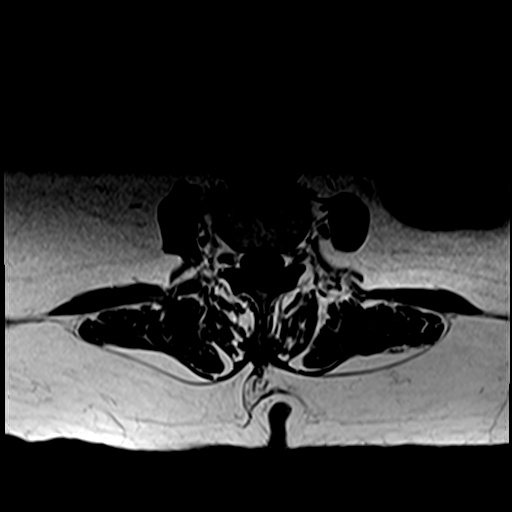
[im 21/26]
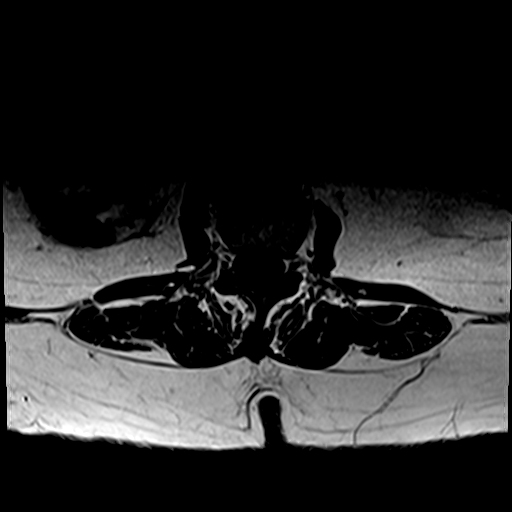
[im 26/26]
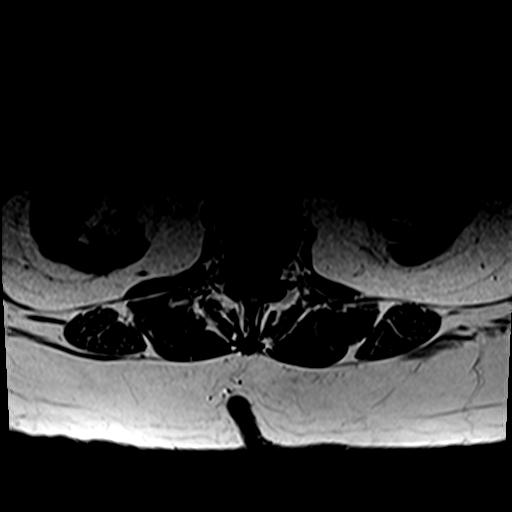

[Series 22: t2_axial obl · oblique · 4.0mm · 0.62mm/px · 2 of 12 slices shown]
[im 1/12]
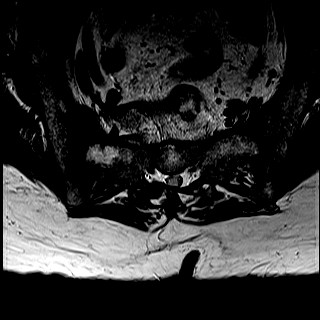
[im 6/12]
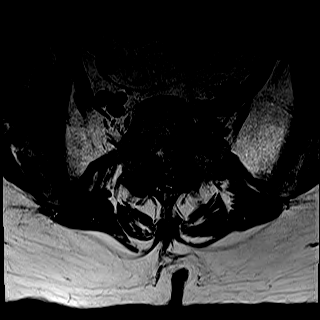

[34 of 48 positions shown; findings below may reference images not displayed]

FINDINGS: COUNT/LABELING: Please note that spinal labeling was performed from C2 downwards from scout images.

ALIGNMENT: There is grade 3 anterolisthesis of L5 over S1 and bilateral L5 pars defects.

VERTEBRAL BODY HEIGHTS: There is mild height loss of the L5 vertebral body, which may be degenerative in etiology or related to prior compression. No marrow edema.

MARROW SIGNAL: Multilevel degenerative endplate signal changes most prominently at L5-S1.

INTERVERTEBRAL DISCS: Multilevel desiccation. Near complete disc height loss at L5-S1.

FACET JOINTS: Multilevel facet joint arthropathy worst at L4-5 and L5-S1.

PARASPINAL SOFT TISSUES: No significant paraspinal soft tissue signal abnormality.

DISTAL THORACIC SPINAL CORD AND CONUS MEDULLARIS: The distal thoracic spinal cord is normal in caliber and signal with the conus medullaris terminating at L1, which is normal.

CAUDA EQUINA NERVE ROOTS: Normal in appearance.

FINDINGS BY LEVEL:

T12-L1: No significant canal or foraminal stenosis.

L1-L2:  No significant canal or foraminal stenosis.

L2-L3:  No significant canal or foraminal stenosis.

L3-L4:  No significant canal or foraminal stenosis. 

L4-L5:  Diffuse disc bulge, bilateral facet arthropathy and L5-S1 spondylolisthesis cause mild canal stenosis and mild bilateral foraminal narrowing.

L5-S1:  L5-S1 spondylolisthesis causes moderate spinal canal stenosis and severe bilateral foraminal narrowing.

OTHER: There is severe bilateral SI joint osteoarthritis. There is a T2 hypointense linear signal along the right sacral ala and associated partially visualized marrow edema on the STIR sequence, which may suggest a subtle insufficiency fracture.

Peripelvic T1 hypointense and T2 hyperintense lesions favored to represent parapelvic cysts.
IMPRESSION: 1.
Bilateral L5 spondylolysis and grade 3 anterolisthesis causing moderate spinal canal stenosis and severe bilateral foraminal narrowing.

2.
There is severe bilateral SI joint osteoarthritis.

3.
Abnormal signal involving the right sacral ala may be degenerative in etiology from adjacent severe SI joint osteoarthritis but is suspicious for a subtle insufficiency fracture. Consider obtaining a dedicated sacrum MRI without contrast for further evaluation.

IMPORTANT FINDING

Stat fax

## 2019-07-09 IMAGING — CR L-SPINE 2-3 VWS
1 series · 4 of 4 positions shown · non-contrast
Comparison: MRI lumbar spine study dated same day.

HISTORY: 73 year-old Female with low back pain.
TECHNIQUE: AP standing, lateral neutral, lateral flexion and lateral extension views. A total of 4 views.

[Series 1: extension · 0.17mm/px · 4 of 4 slices shown]
[im 1/4]
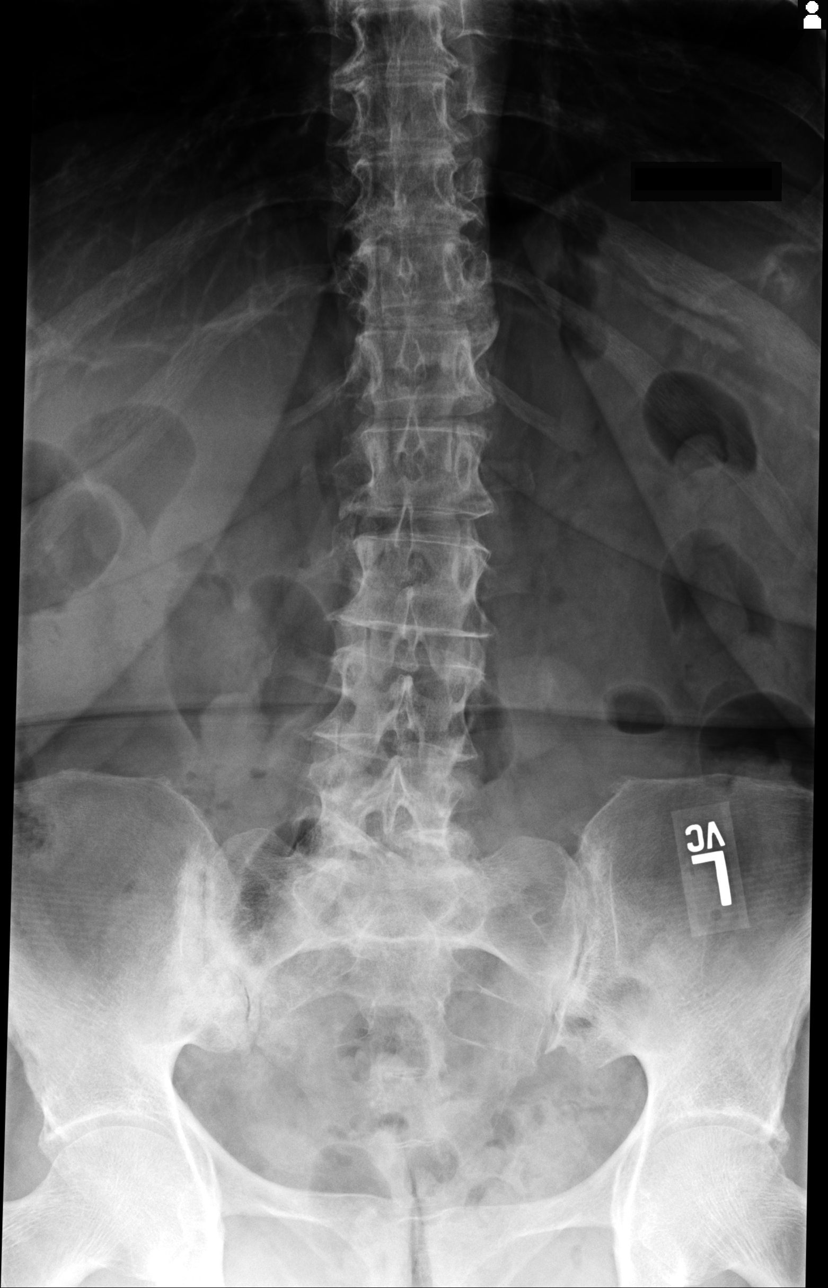
[im 2/4]
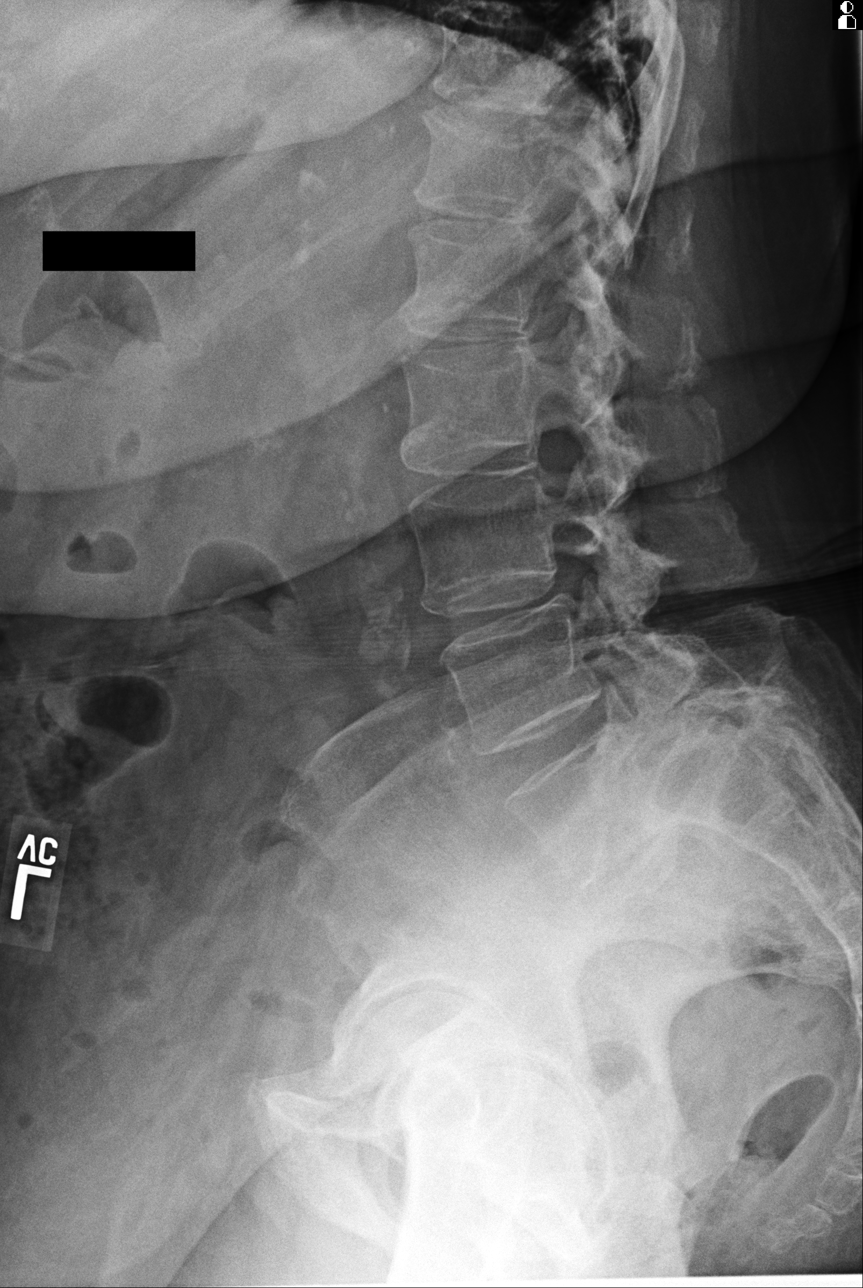
[im 3/4]
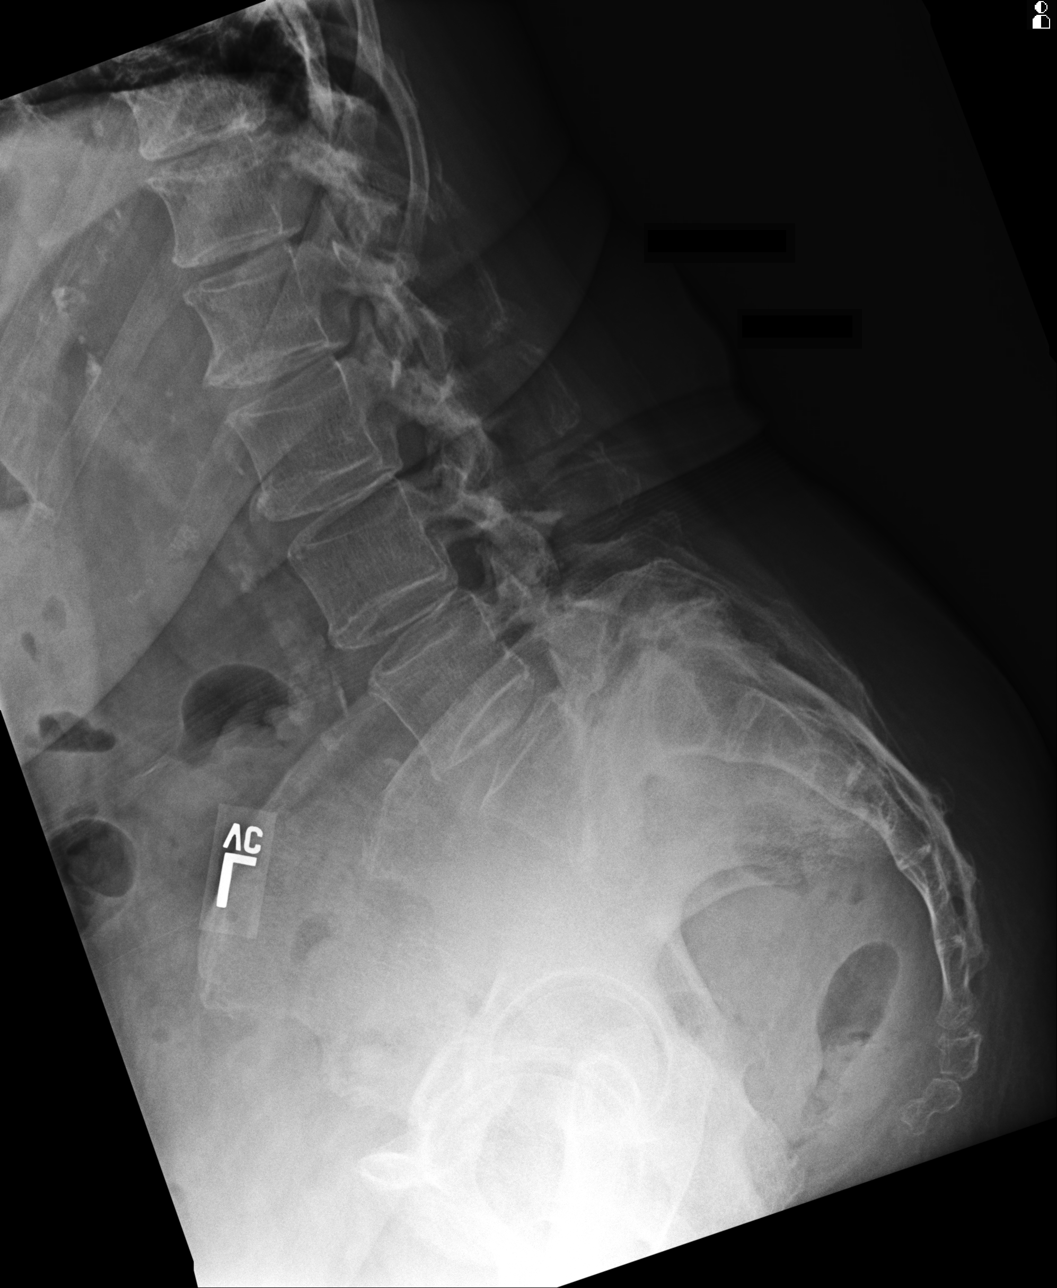
[im 4/4]
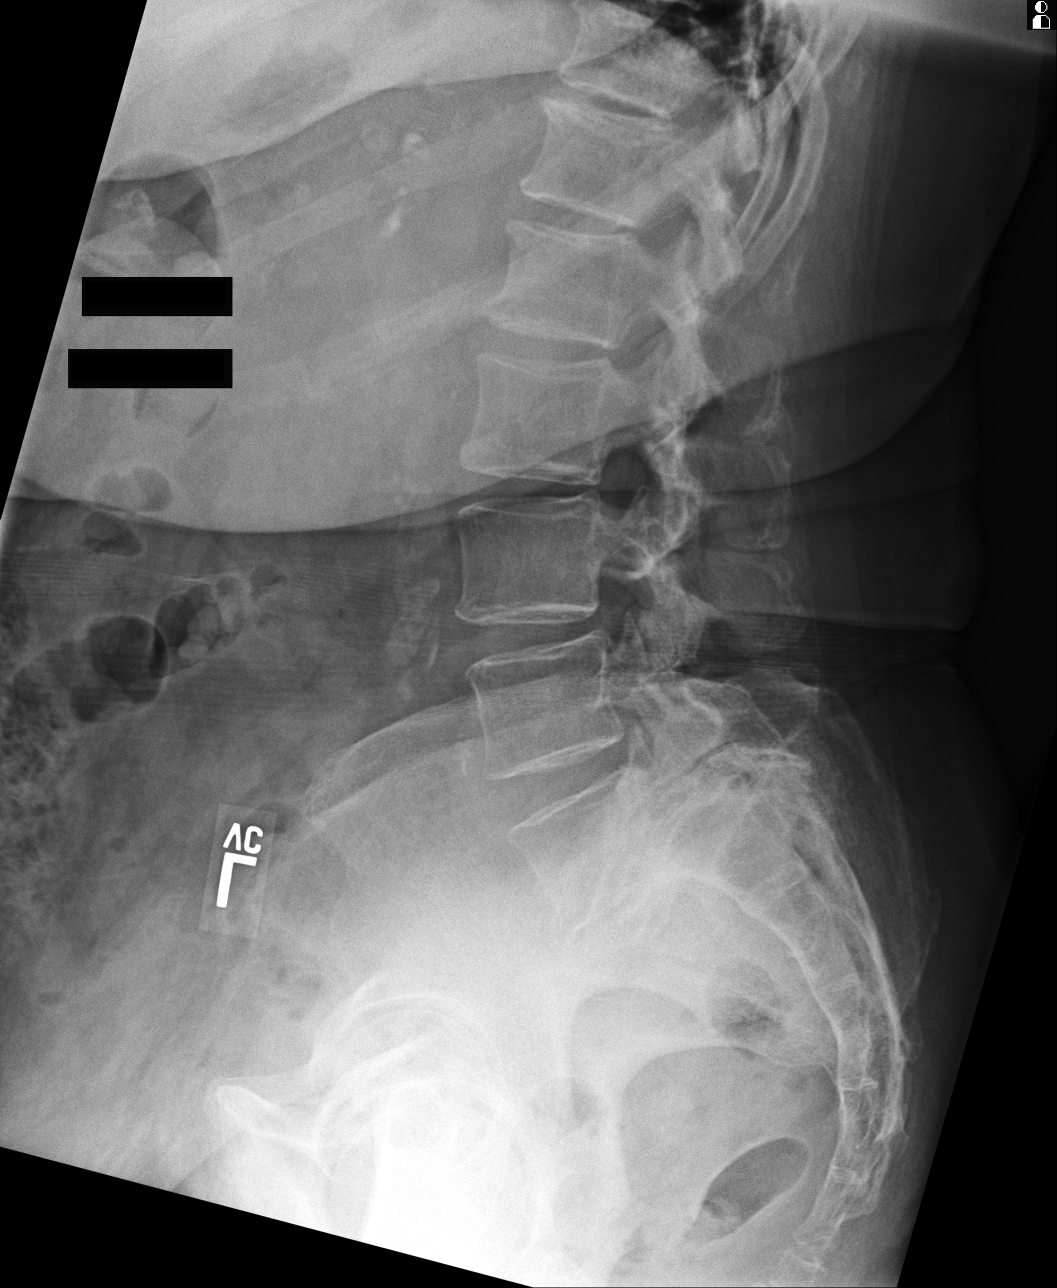

[4 of 4 positions shown; findings below may reference images not displayed]

FINDINGS: Alignment: There is a grade II/III anterior spondylolisthesis of L5 over S1 vertebral body. There is also bilateral spondylolysis at L5-S1 level.

Degenerative disc changes: Identified at the L5-S1 level.

Facet arthropathy changes: None.

Bones: There are 5 lumbar type vertebral bodies. Vertebral body heights are maintained. No acute fractures identified. The pedicles are intact. Sacroiliac joints appear unremarkable. Partially visualized osseous structures of the pelvis are grossly unremarkable.

Nonspecific bowel gas is present. There are vascular calcifications.
IMPRESSION: There is a grade [DATE] anterior spondylolisthesis of L5 over S1 vertebral body. There is also bilateral spondylolysis at L5-S1 level. Degenerative disc change is present at the L5-S1 level.

## 2020-01-07 IMAGING — MG MAMMO SCRN BIL W/CAD TOMO
6 of 9 series · 6 of 25 positions shown · non-contrast
Comparison: The present examination has been compared to prior imaging studies.

Images Obtained from [HOSPITAL] Imaging
INDICATION: Screening.
TECHNIQUE: Bilateral 2-D digital screening mammogram was performed followed by 3-D tomosynthesis.  Current study was also evaluated with a computer aided detection (CAD) system.
MAMMOGRAM FINDINGS:
There are scattered areas of fibroglandular density.
No suspicious abnormality is seen in either breast.  There are no significant changes from the prior study.

[R MLO (1 of 2)]
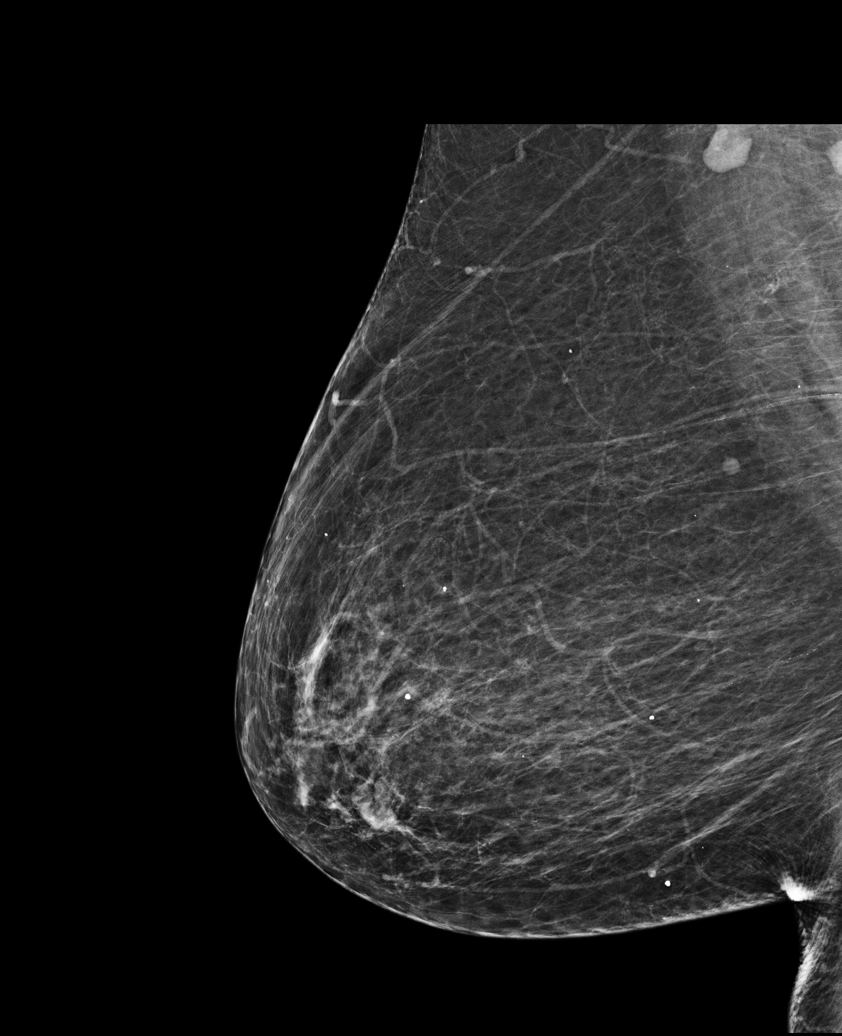

[L MLO]
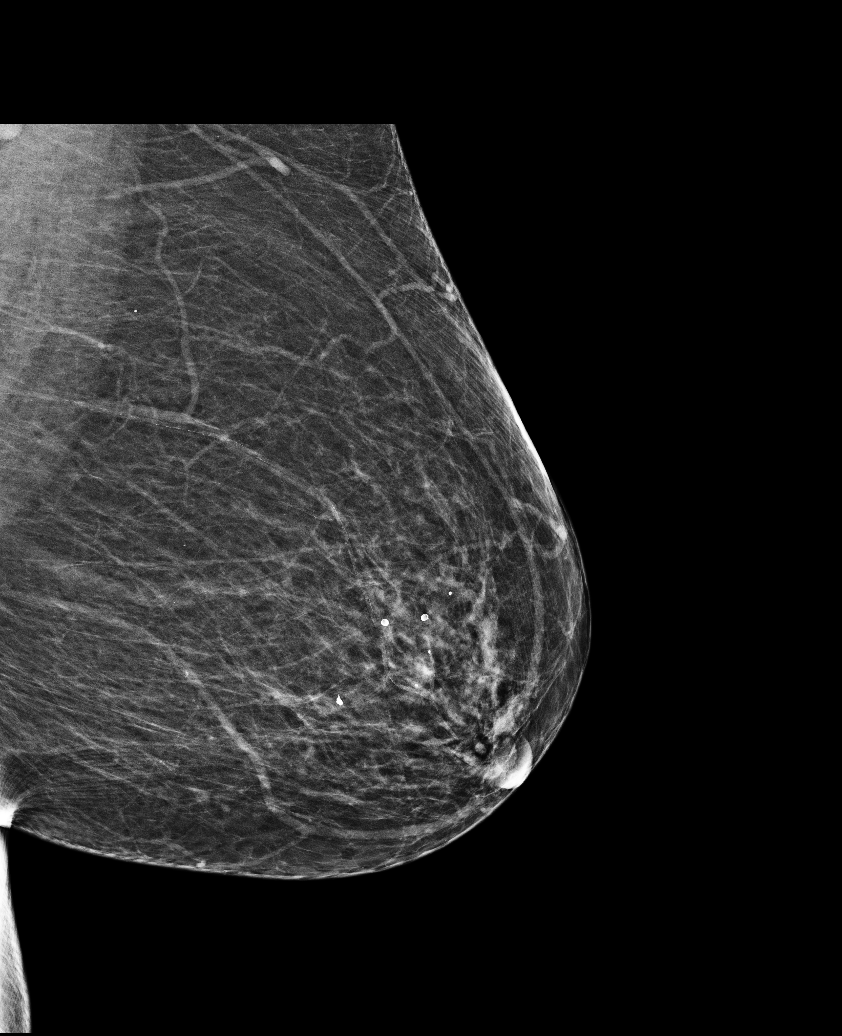

[R CC]
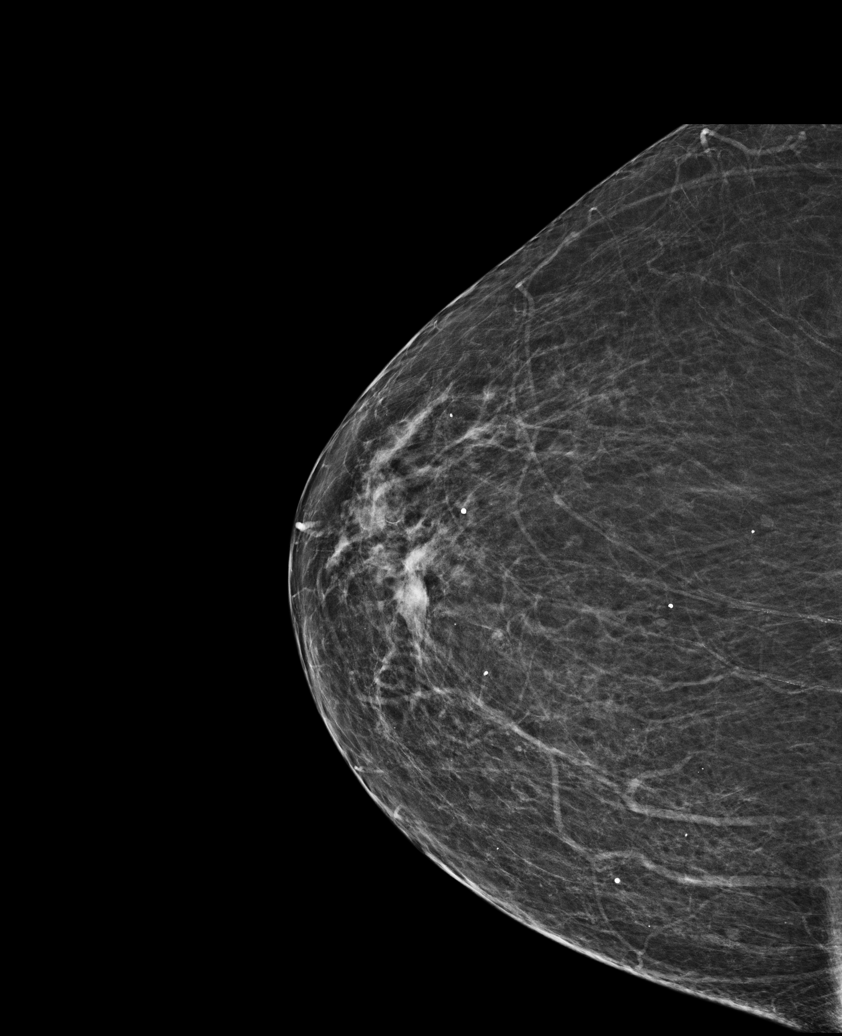

[R MLO (2 of 2)]
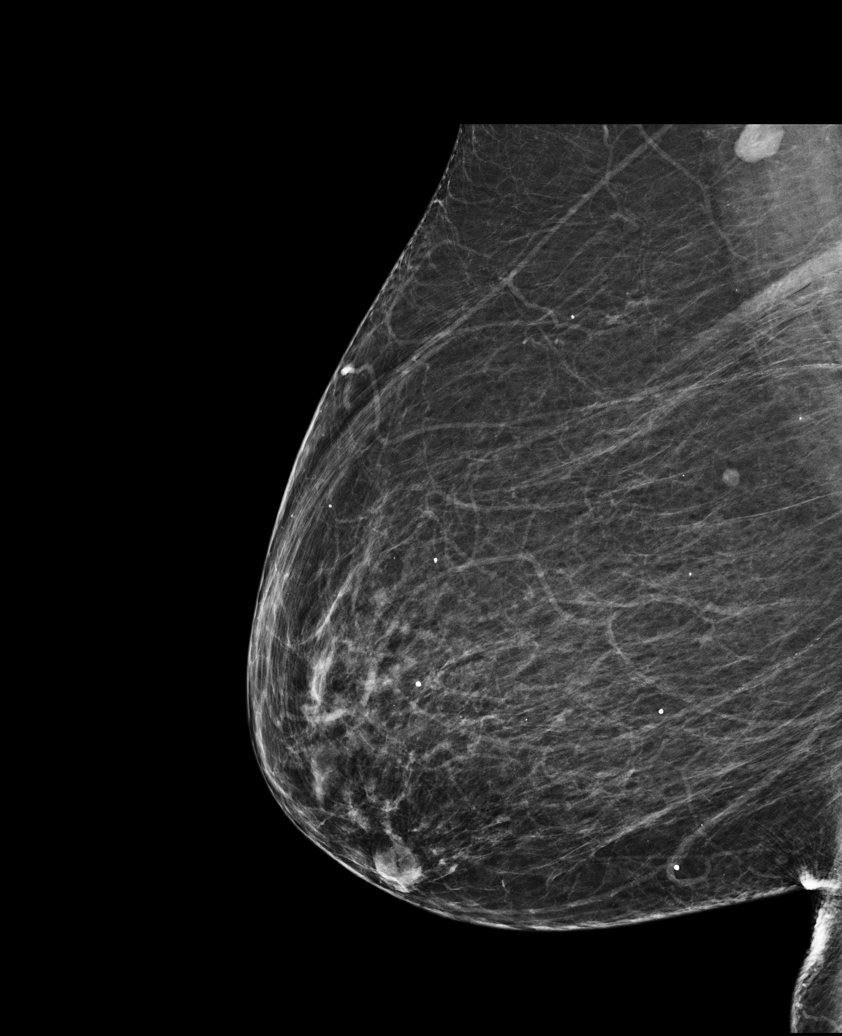

[L CC]
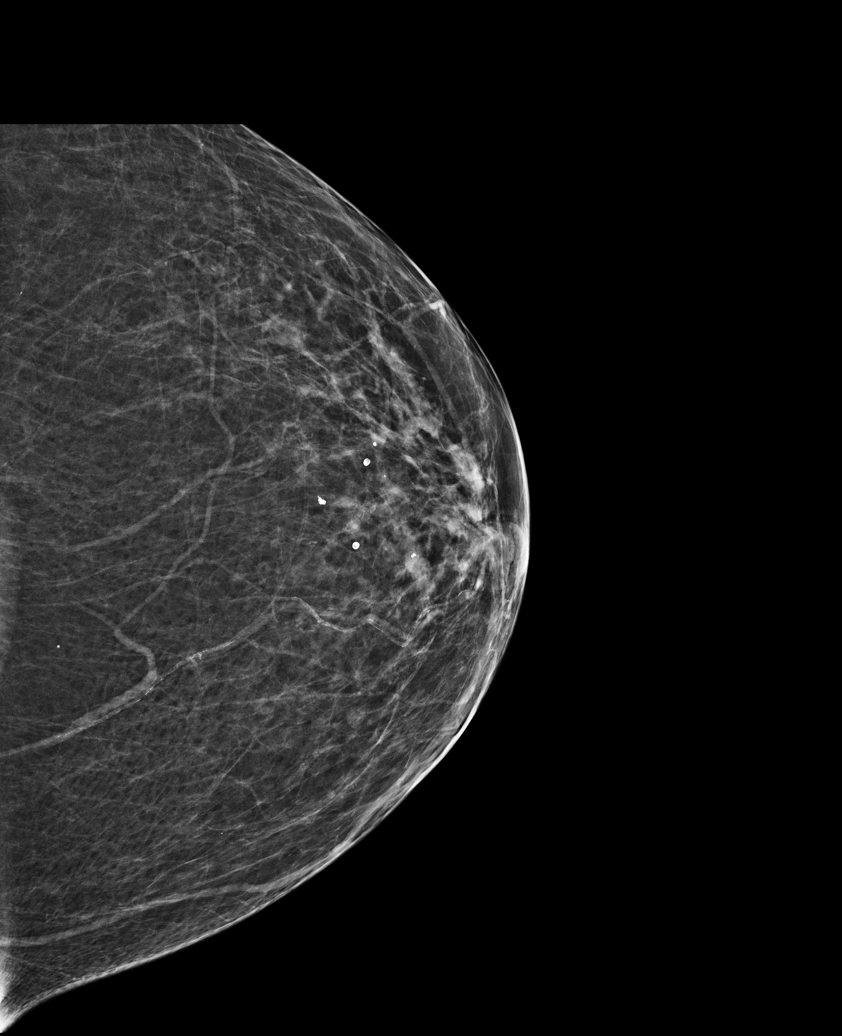

[L CC tomo · tomo slice 27/53.0]
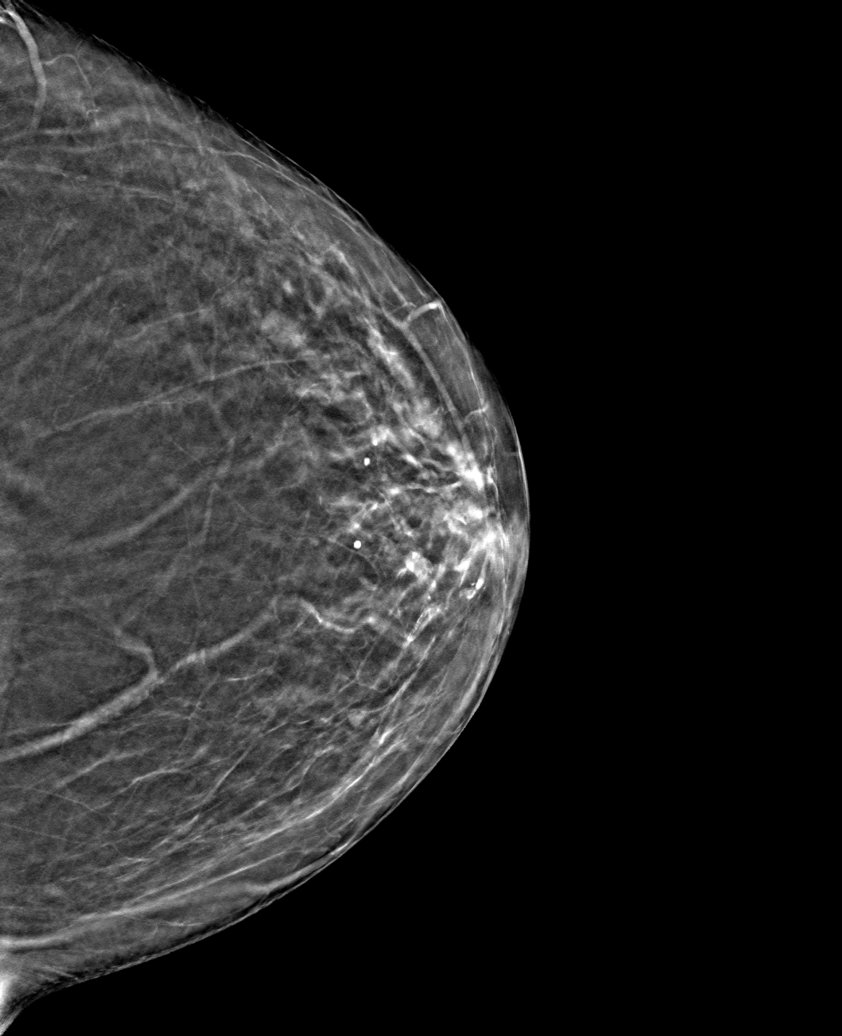

[6 of 25 positions shown; findings below may reference images not displayed]

IMPRESSION: There is no mammographic evidence of malignancy.
Screening mammogram recommended in 1 year.
BI-RADS Category 1: Negative

## 2020-01-08 IMAGING — CR [HOSPITAL] FOOT RT 2VWS
1 series · 2 of 2 positions shown · non-contrast
Comparison: None

HISTORY/INDICATIONS: Right foot pain.
TECHNIQUE: Right foot 2 views.

[Series 1: x foot ap right · 0.15mm/px · 2 of 2 slices shown]
[im 1/2]
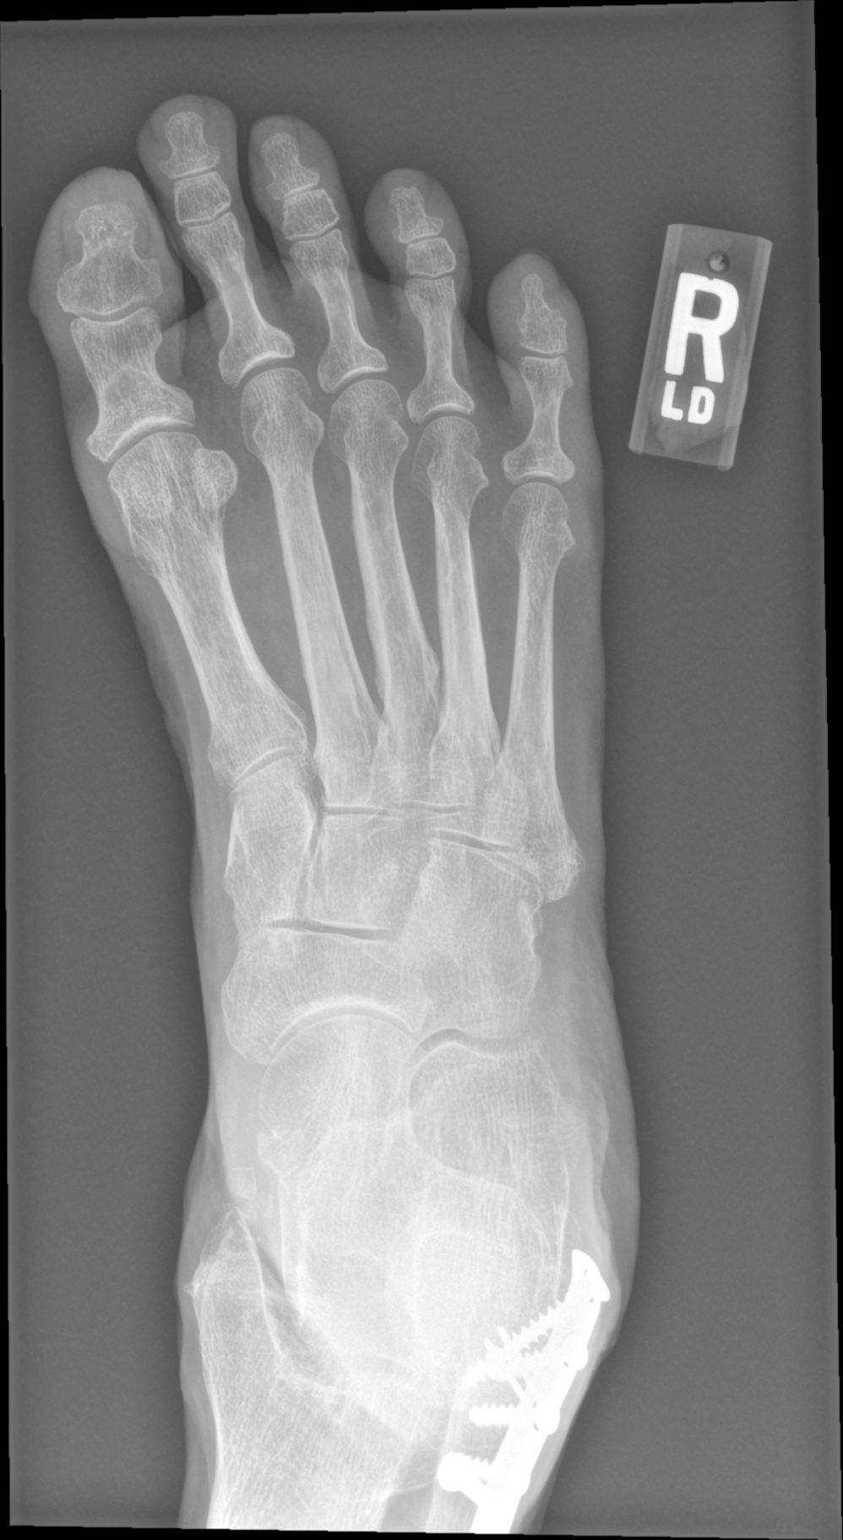
[im 2/2]
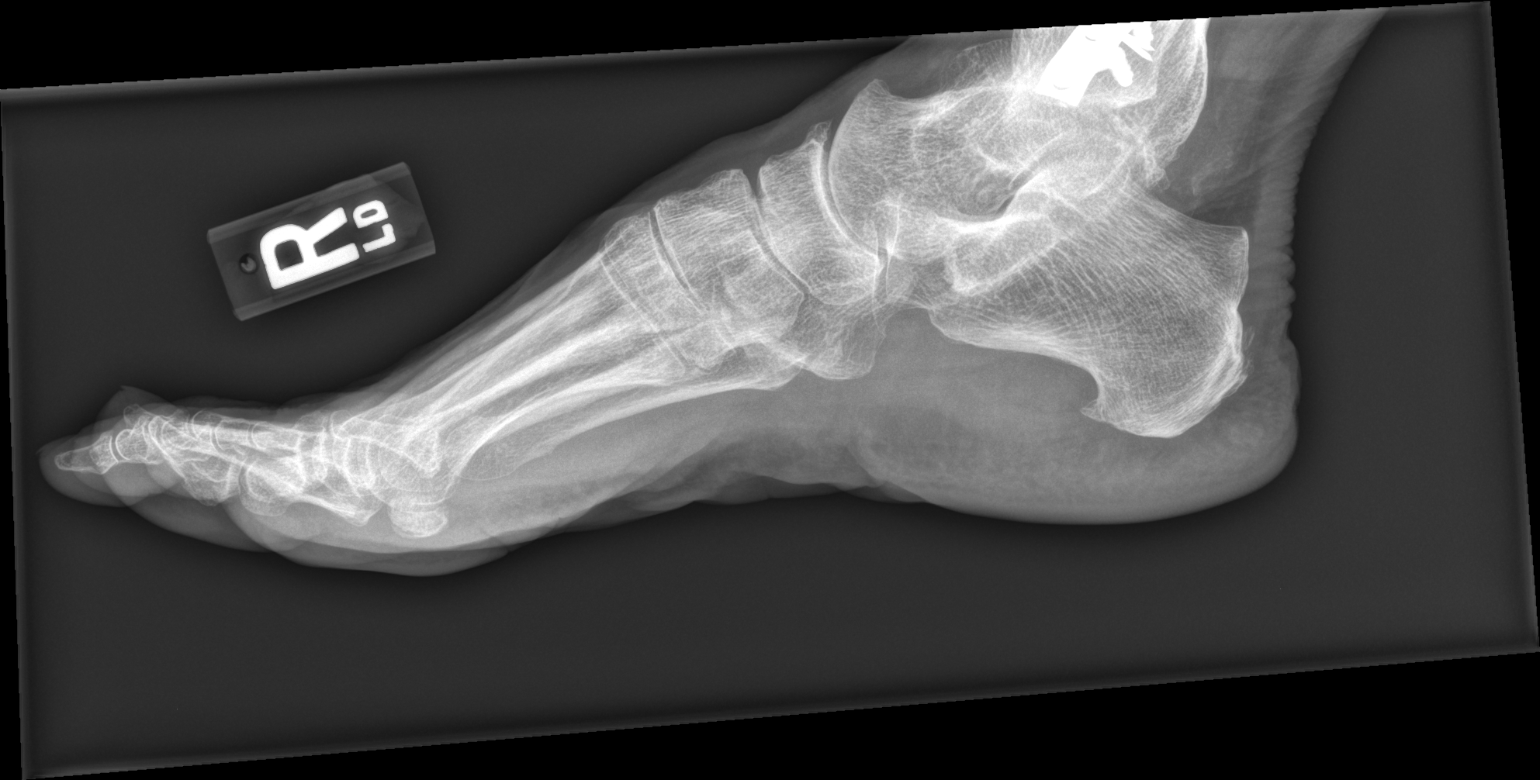

[2 of 2 positions shown; findings below may reference images not displayed]

FINDINGS: There is no fracture or subluxation. Soft tissues are unremarkable. No foreign body or subcutaneous gas is identified. A small plantar heel spur is seen. Postsurgical fixation of the lateral malleolus is noted..  Atherosclerotic calcifications are not present.
IMPRESSION: No acute disease

## 2020-09-08 IMAGING — CR [HOSPITAL] ABD KUB ONLY
1 series · 2 of 2 positions shown · non-contrast
Comparison: None

HISTORY/INDICATIONS: Right flank pain
TECHNIQUE: KUB 1 view, 2 images

[Series 1: t abdomen · 0.15mm/px · 2 of 2 slices shown]
[im 1/2]
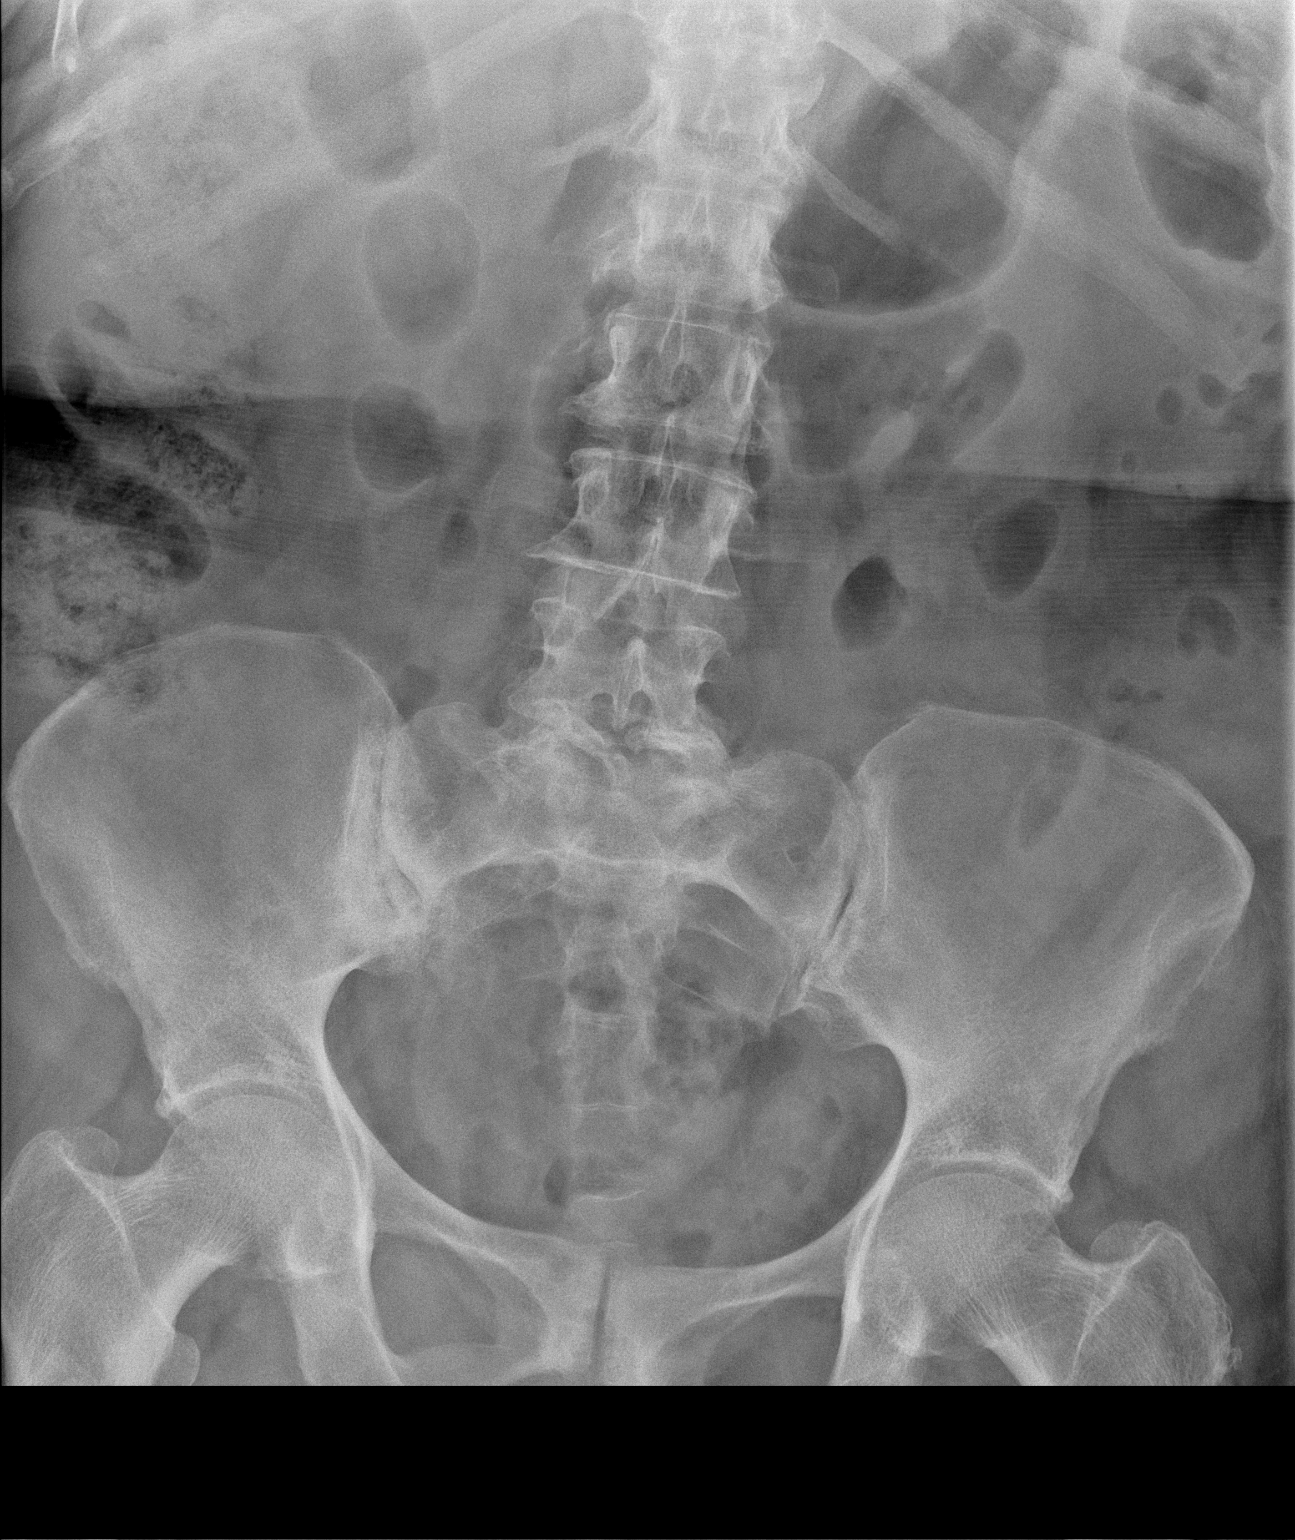
[im 2/2]
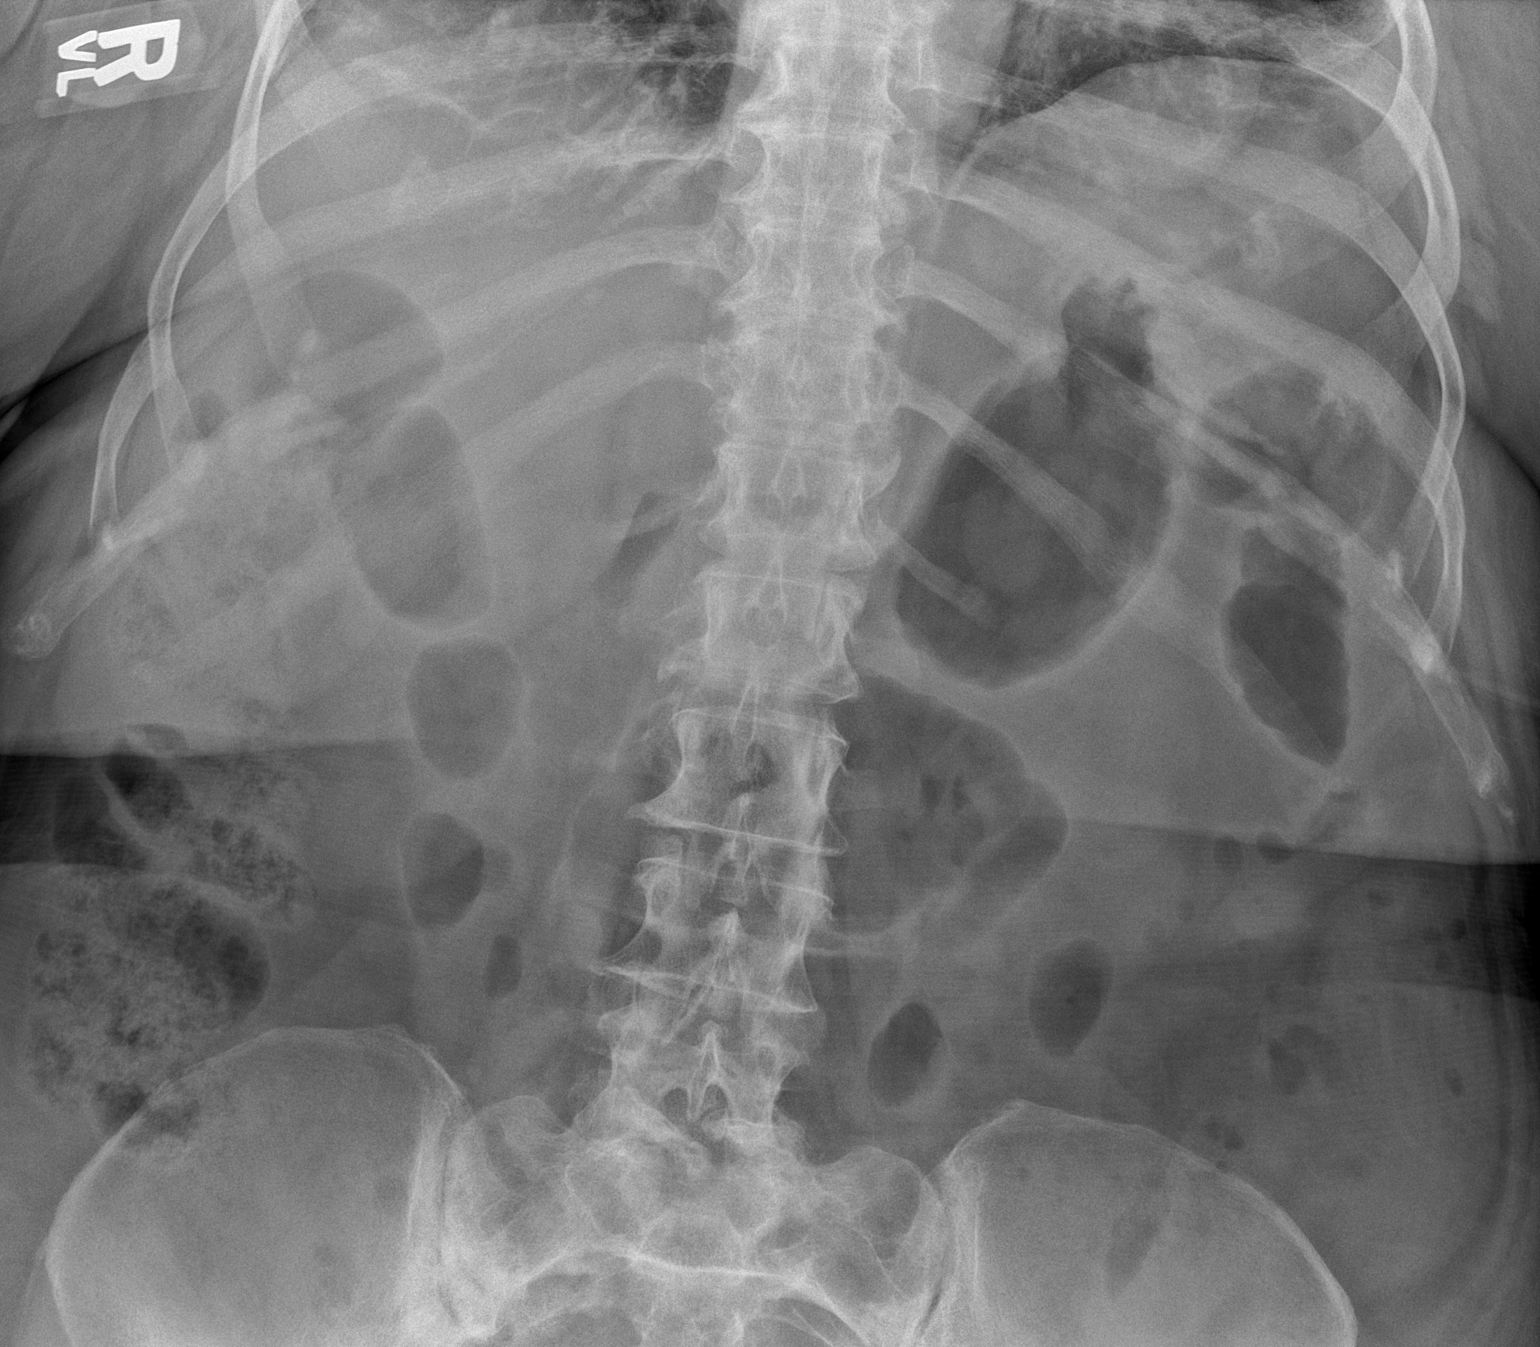

[2 of 2 positions shown; findings below may reference images not displayed]

FINDINGS: I see no radiopaque stones. Moderate right SI joint and symphysis DJD. Bowel gas pattern unremarkable.  Atherosclerotic calcifications not present.
IMPRESSION: No acute disease.

## 2020-11-20 IMAGING — MR MRI KNEE LT WO CONTRAST
5 of 6 series · 33 of 40 positions shown · IV contrast (gadolinium)
Comparison: None

HISTORY: Acute pain of left knee
TECHNIQUE: Multiplanar and multisequence MR imaging of the left knee was performed without the administration of intravenous gadolinium.

[Series 8: t2_axial_fs · axial · left · 3.0mm · 0.45mm/px · z∈[-64,+52]mm · 8 of 30 slices shown]
[im 1/30]
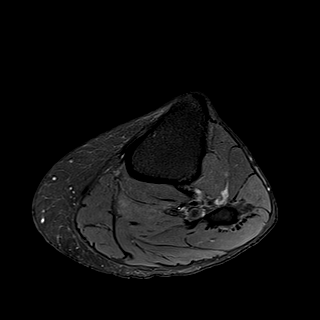
[im 5/30]
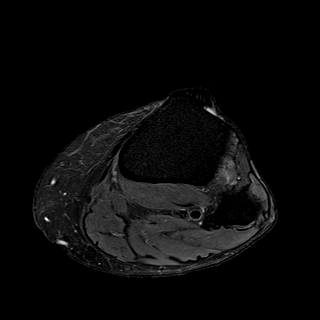
[im 9/30]
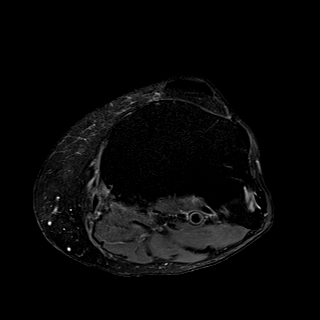
[im 13/30]
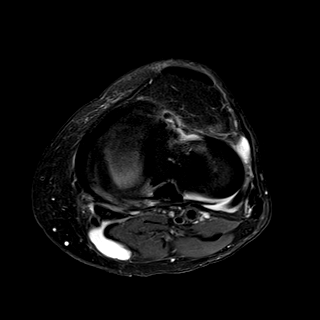
[im 17/30]
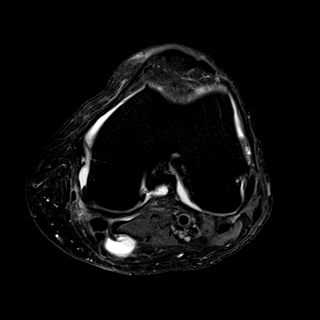
[im 21/30]
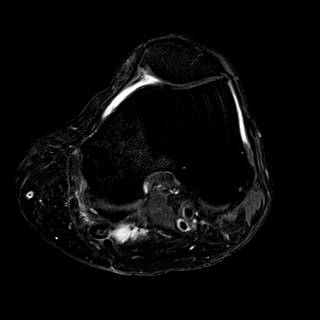
[im 25/30]
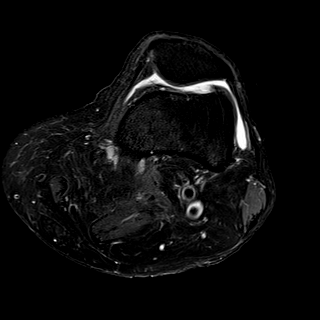
[im 30/30]
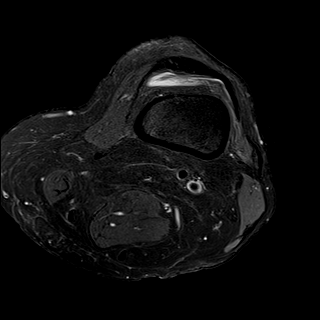

[Series 10: t1_cor · coronal · left · 3.5mm · 0.36mm/px · 7 of 26 slices shown]
[im 1/26]
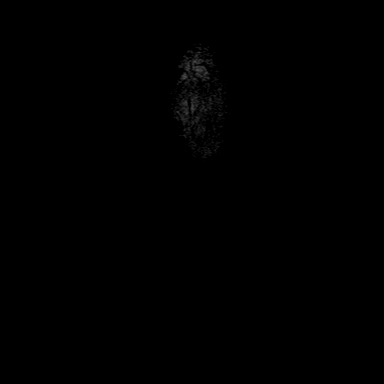
[im 5/26]
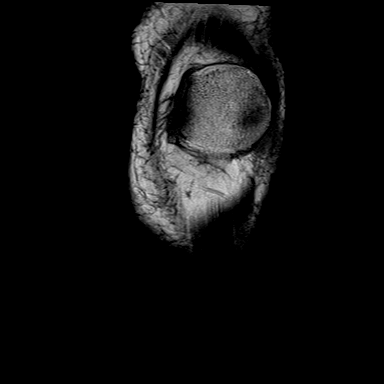
[im 9/26]
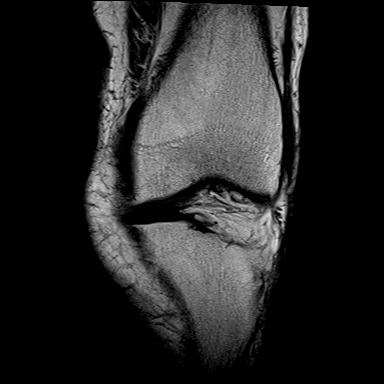
[im 13/26]
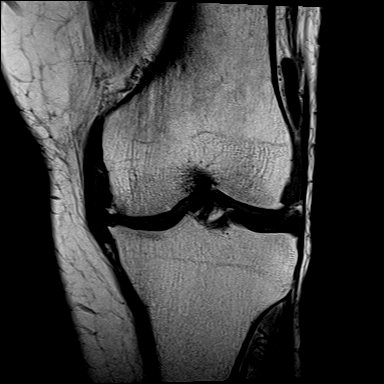
[im 17/26]
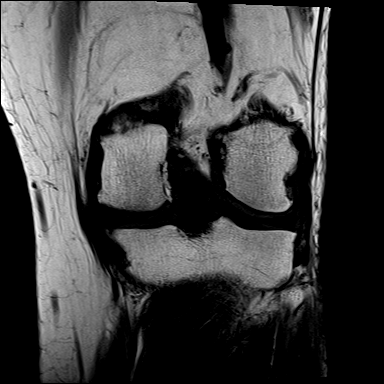
[im 21/26]
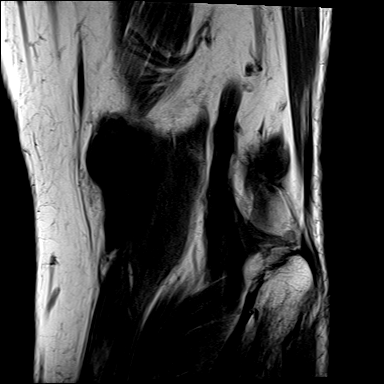
[im 26/26]
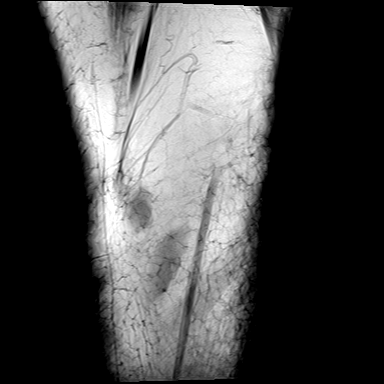

[Series 11: t2_cor_fs · coronal · left · 3.5mm · 0.44mm/px · 6 of 26 slices shown]
[im 1/26]
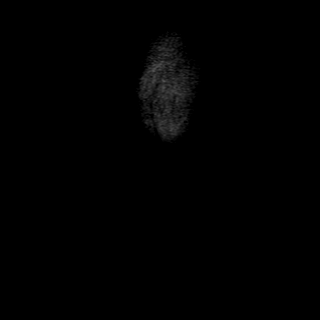
[im 6/26]
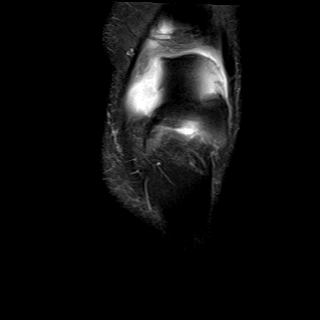
[im 11/26]
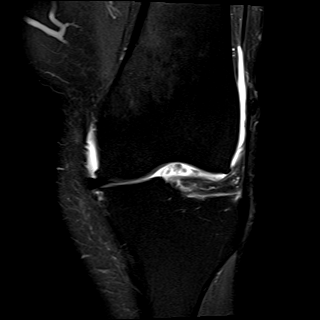
[im 16/26]
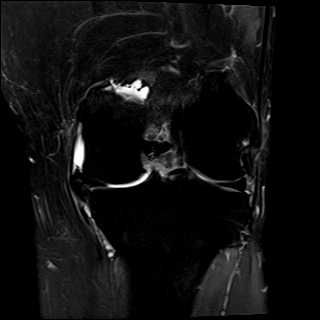
[im 21/26]
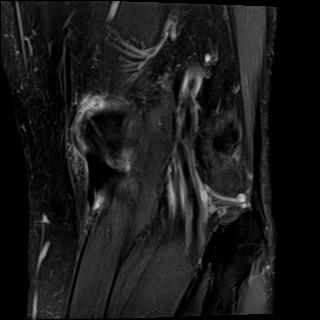
[im 26/26]
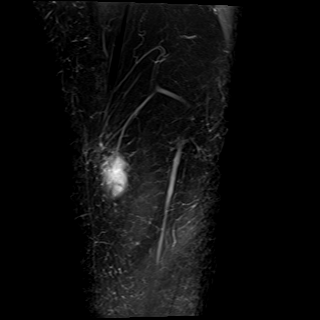

[Series 12: t2_axial_fs_blade · axial · left · 3.0mm · 0.57mm/px · z∈[-64,+52]mm · 7 of 30 slices shown]
[im 1/30]
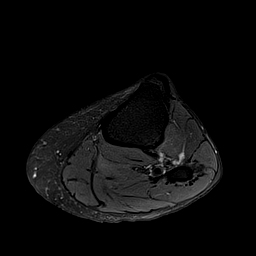
[im 5/30]
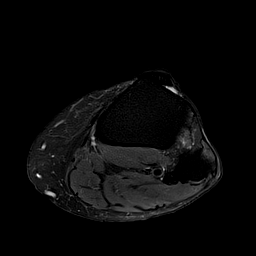
[im 10/30]
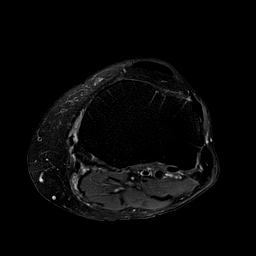
[im 15/30]
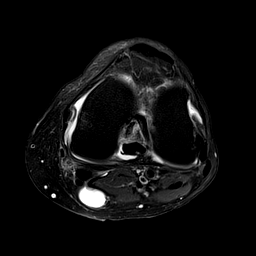
[im 20/30]
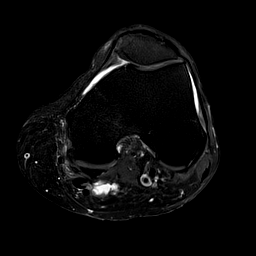
[im 25/30]
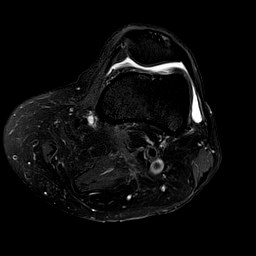
[im 30/30]
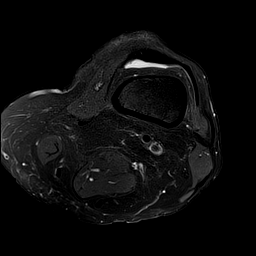

[Series 1022: pd_sag_fs_new · sagittal · left · 3.0mm · 0.23mm/px · 5 of 26 slices shown]
[im 1/26]
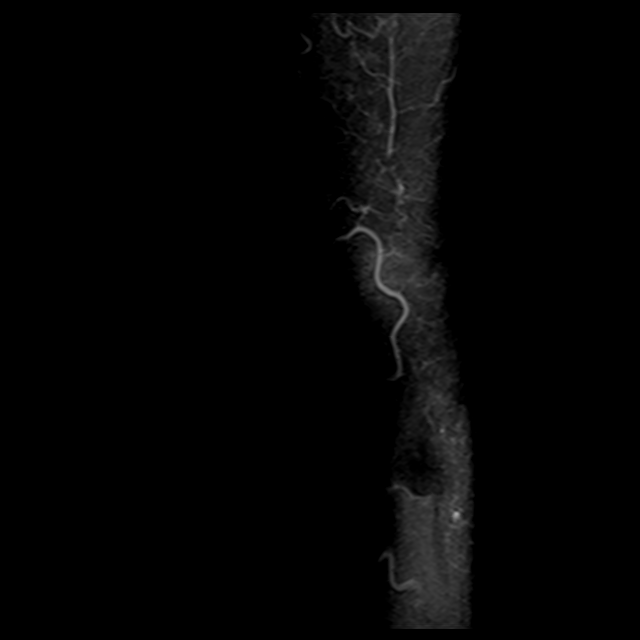
[im 6/26]
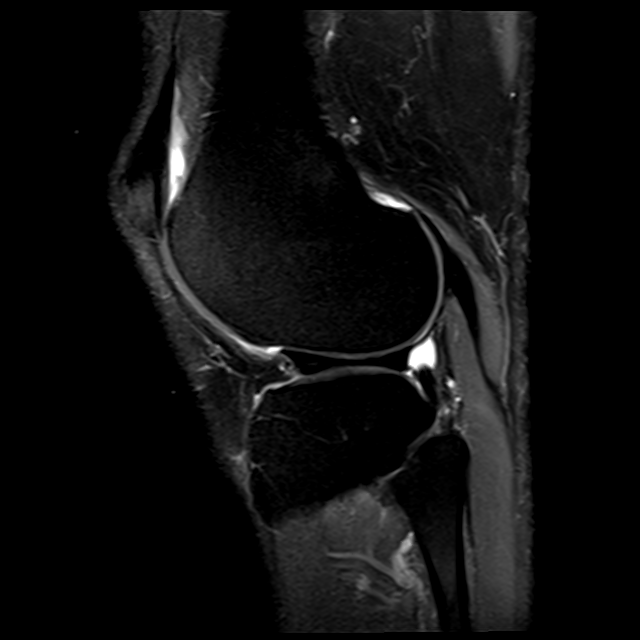
[im 11/26]
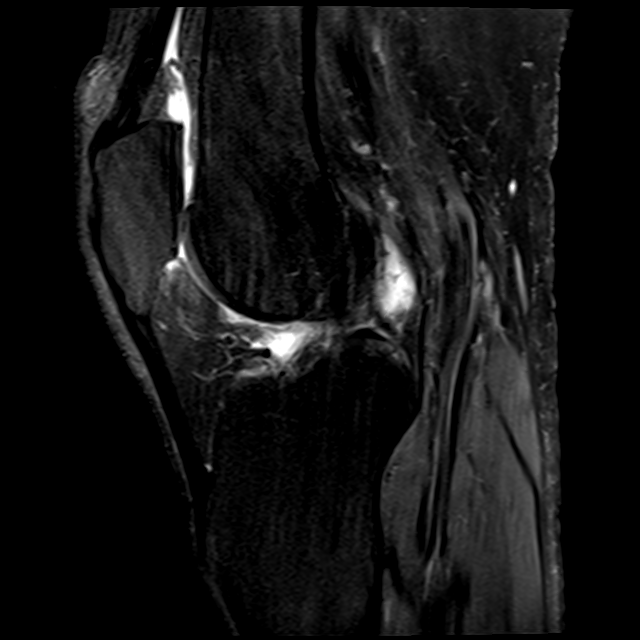
[im 16/26]
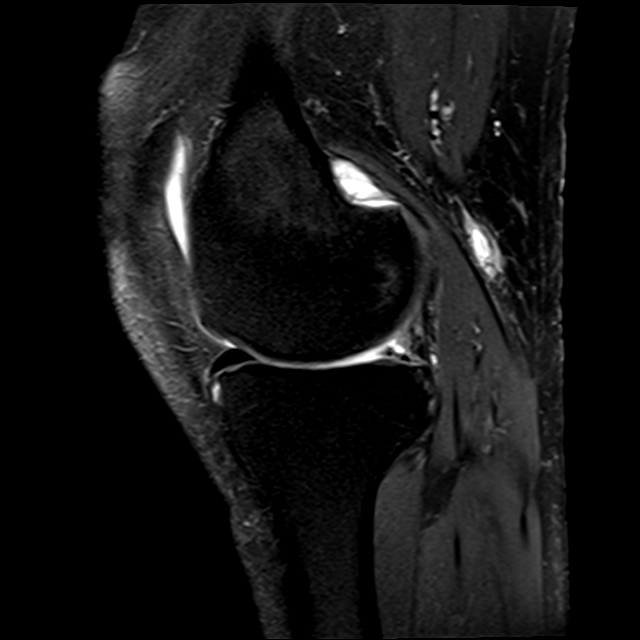
[im 21/26]
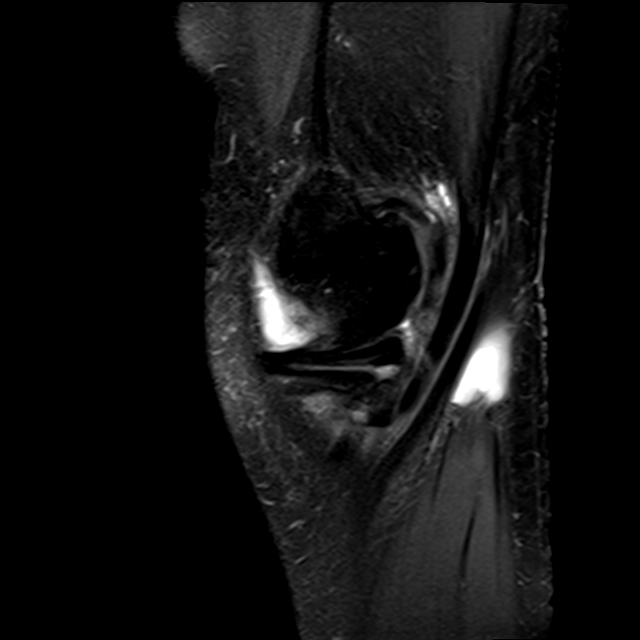

[33 of 40 positions shown; findings below may reference images not displayed]

FINDINGS: The anterior cruciate, posterior cruciate, medial collateral and lateral collateral ligaments are intact.

There is a large radial root tear posterior horn medial meniscus. The body is largely extruded. Horizontal tear involving the body. Small meniscal flap extending into the medial gutter.

Lateral meniscus intact.

Small to moderate effusion.

Moderate chondral surface irregularity medial compartment with associated reactive subcortical marrow edema and marginal osteophytosis. Mild chondral surface irregularity lateral compartment and patellofemoral compartment.

Osseous structures demonstrate no fractures or destructive lesions.

Extensor mechanism is intact. Patellar retinacula are unremarkable. Signal from muscle normal. Muscle mass normal. Small Baker's cyst. No solid lesions. Ganglion cyst popliteal fossa near the femoral attachment proximal tendon medial head gastrocnemius. No adenopathy.

Intra-articular fat pads are normal.
IMPRESSION: 1. Large radial root tear posterior horn medial meniscus. The body is largely extruded. Horizontal tear involving the body with a small meniscal flap extending the medial gutter.

2. Moderate DJD medial compartment with associated reactive subcortical marrow edema.

3. Small to moderate effusion.

4. Intact cruciate and collateral ligaments.

5. Small Baker's cyst and ganglion cyst.

## 2021-07-20 IMAGING — CR L-SPINE 4 VWS MIN
1 series · 5 of 5 positions shown · non-contrast
Comparison: Lumbar spine radiographs 07/09/2019

HISTORY: 75 year old Female with spondylolisthesis
TECHNIQUE: 5 view radiograph of the lumbar spine.

[Series 1: t lumbar spine ap · 0.15mm/px · 5 of 5 slices shown]
[im 1/5]
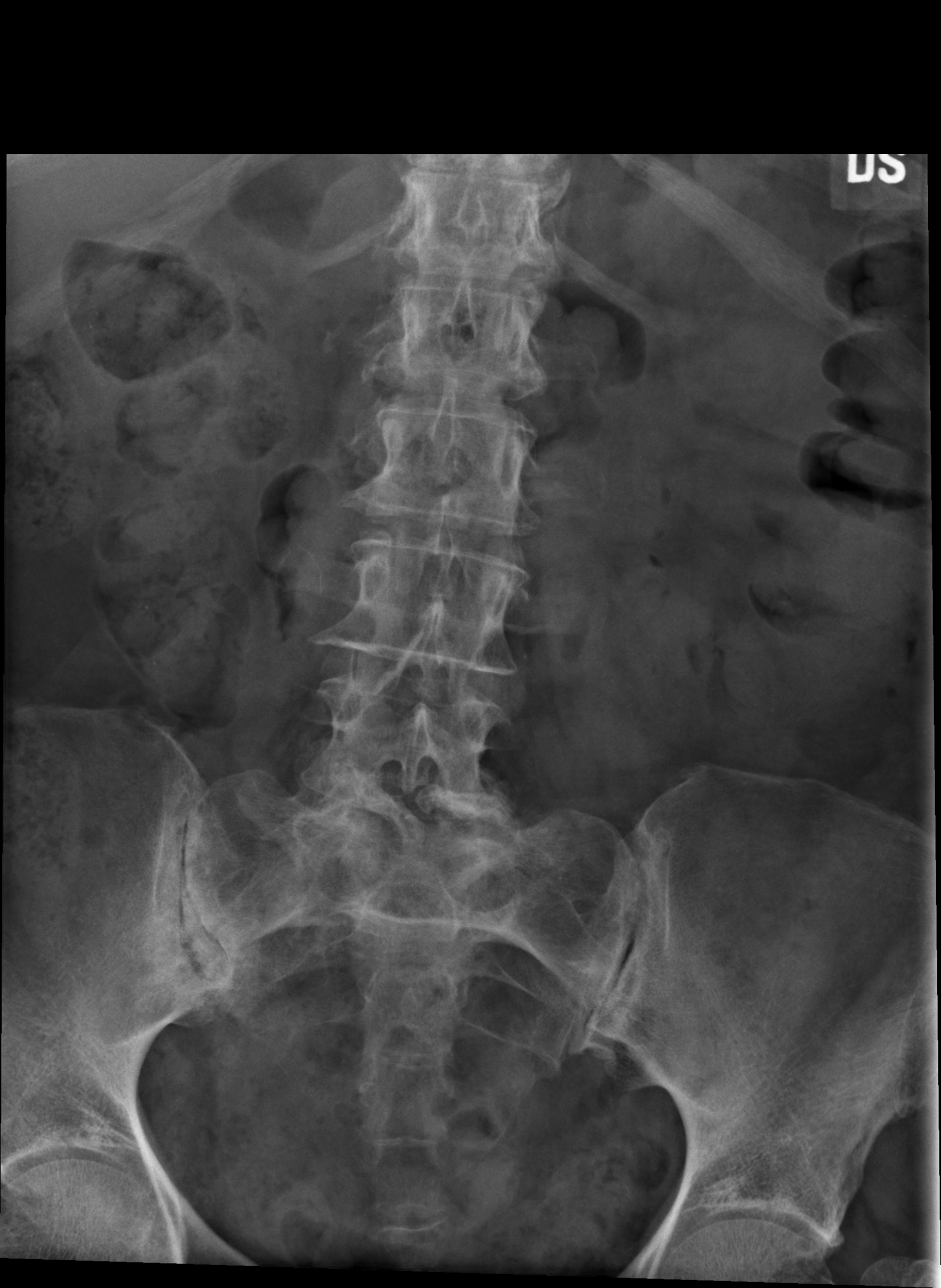
[im 2/5]
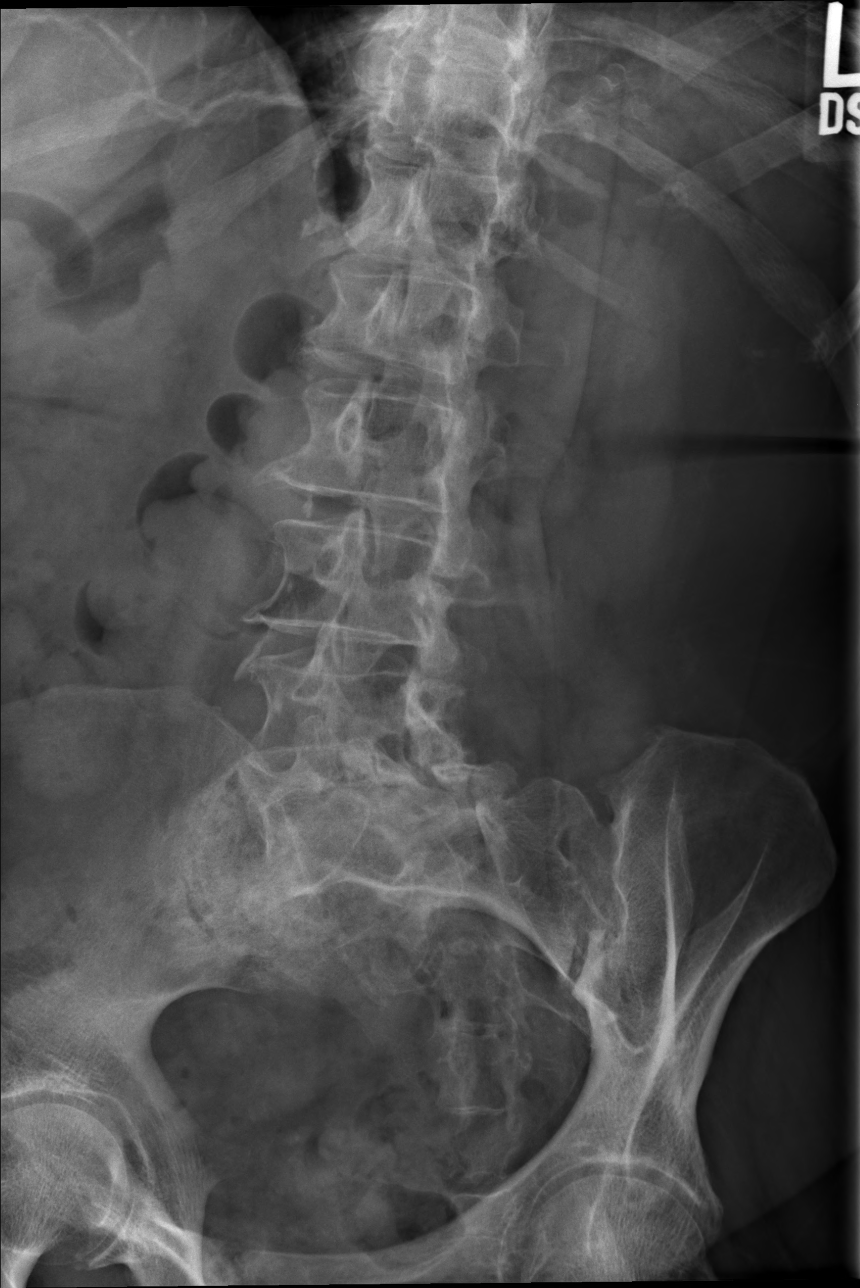
[im 3/5]
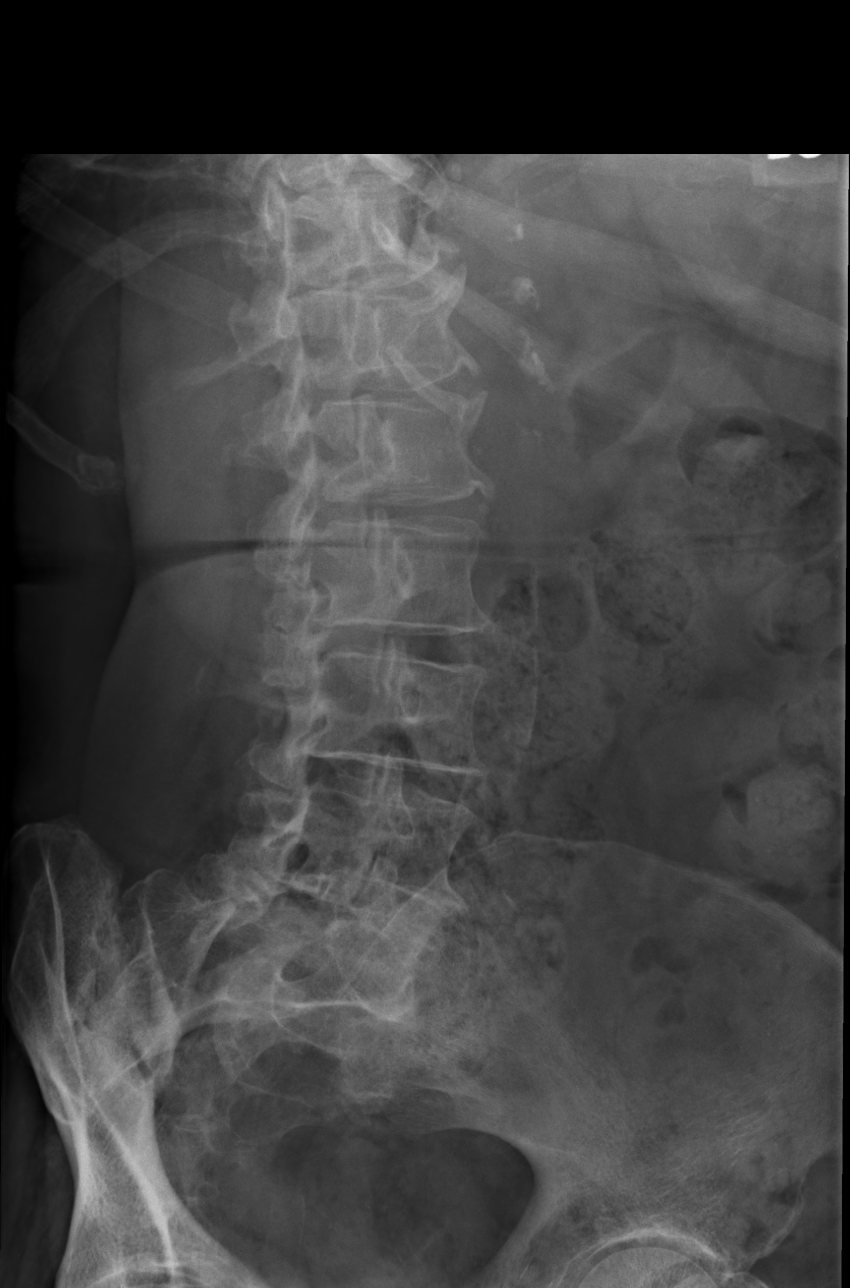
[im 4/5]
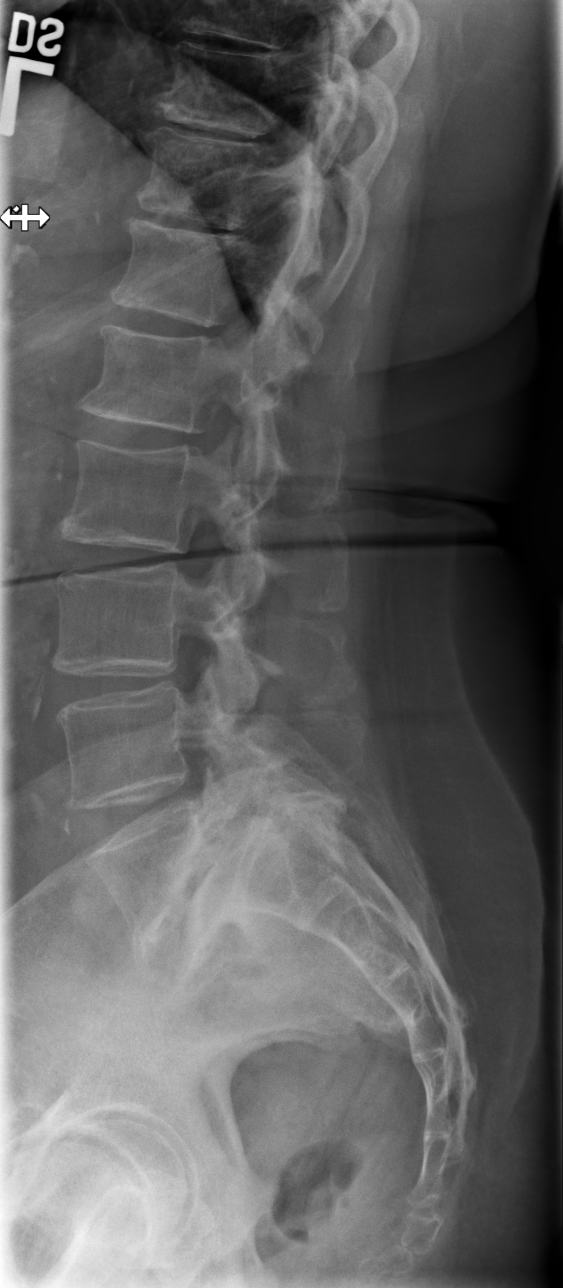
[im 5/5]
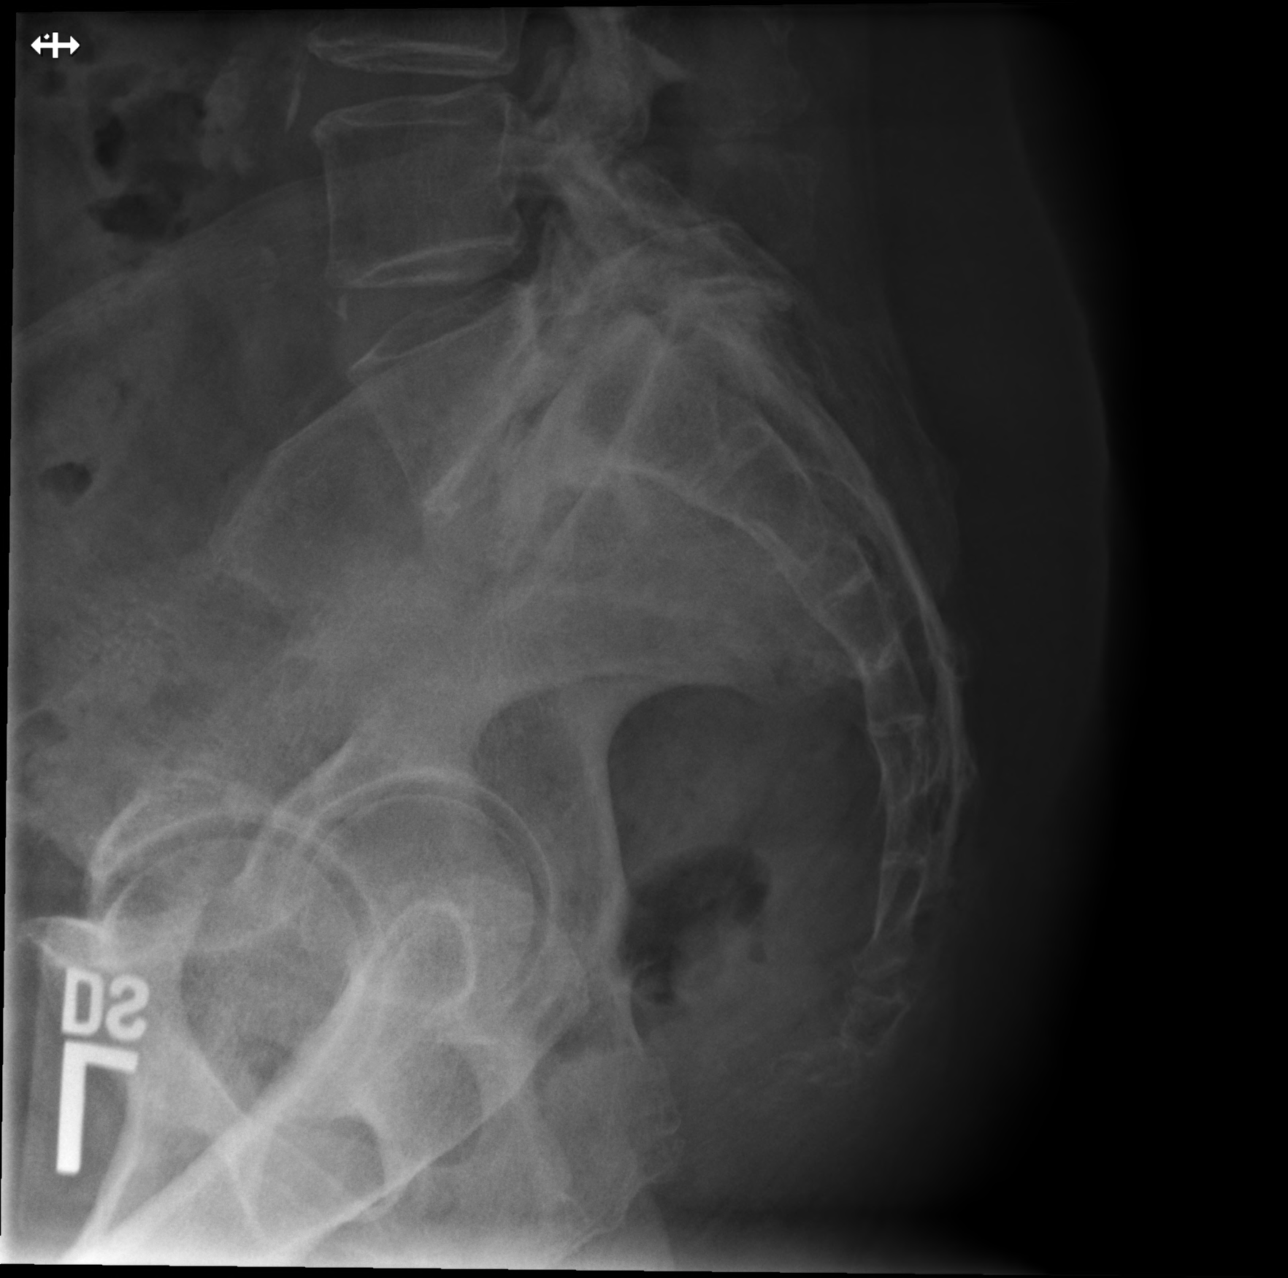

[5 of 5 positions shown; findings below may reference images not displayed]

FINDINGS: Please note that spinal labeling was performed assuming there are 5 nonrib-bearing vertebrae.

Grade 2 (possibly grade 3) anterolisthesis of L5 over S1 with presumed L5 pars defects, similar to prior. Multilevel degenerative disc disease most pronounced at L5-S1. Multilevel facet arthropathy.

Severe right and moderate to severe left SI joint osteoarthritis.
IMPRESSION: 1.
Grade 2 (possibly grade 3) anterolisthesis of L5 over S1 with presumed L5 pars defects, similar to prior.

Multilevel spondylotic changes of the lumbar spine.

## 2021-07-20 IMAGING — MR MRI LSPINE WO CONTRAST
7 series · 37 of 48 positions shown · non-contrast
Comparison: MRI lumbar spine, 07/09/2019.

INDICATION: Spondylolisthesis
TECHNIQUE: Multiplanar, multisequence MR imaging of the lumbar spine was performed without contrast.

[Series 11: iii_aaspine_lspine_mpr_cor · coronal · 1.7mm · 1.67mm/px · 10 of 80 slices shown]
[im 5/80]
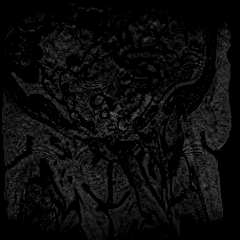
[im 14/80]
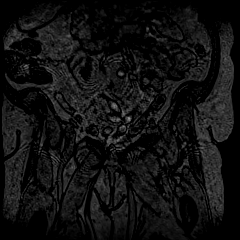
[im 24/80]
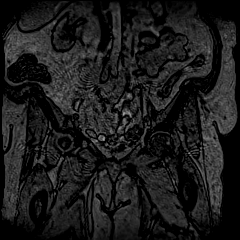
[im 33/80]
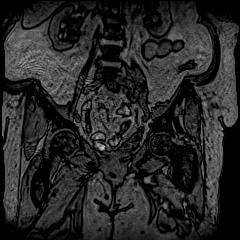
[im 42/80]
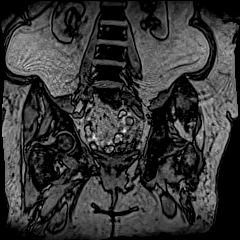
[im 47/80]
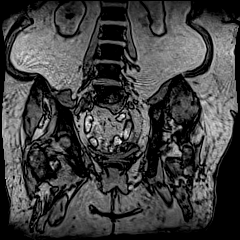
[im 56/80]
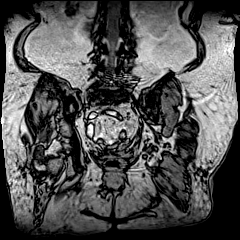
[im 66/80]
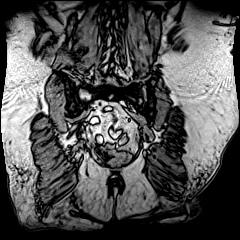
[im 70/80]
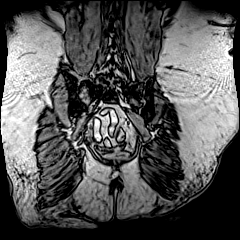
[im 75/80]
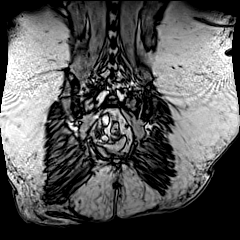

[Series 18: t2_sag · sagittal · 4.0mm · 0.68mm/px · 4 of 15 slices shown]
[im 1/15]
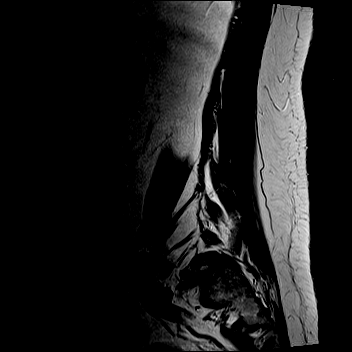
[im 5/15]
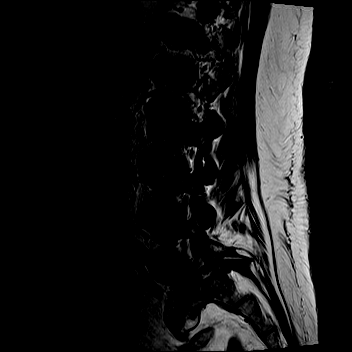
[im 10/15]
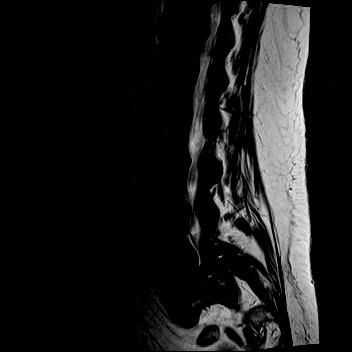
[im 15/15]
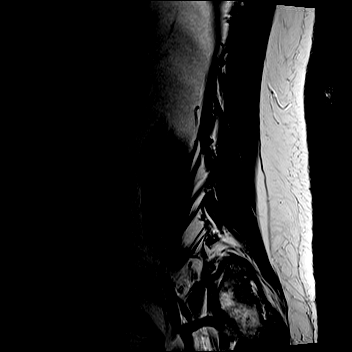

[Series 19: t1_sag · sagittal · 4.0mm · 0.75mm/px · 4 of 15 slices shown]
[im 1/15]
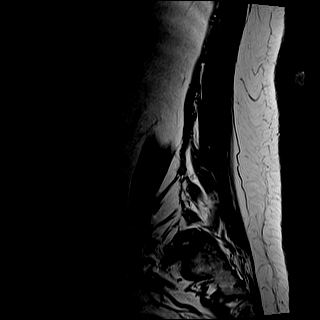
[im 5/15]
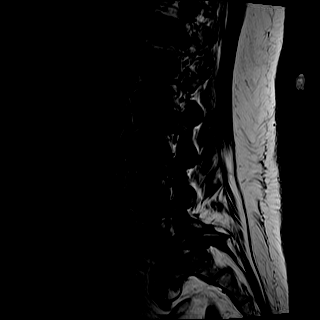
[im 10/15]
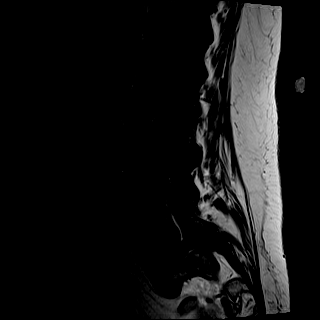
[im 15/15]
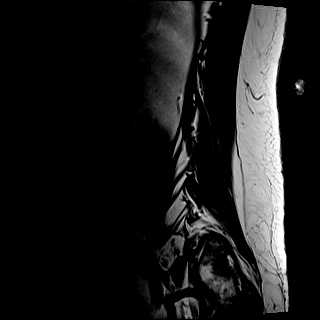

[Series 20: ir_sag · sagittal · 4.0mm · 0.94mm/px · 4 of 15 slices shown]
[im 1/15]
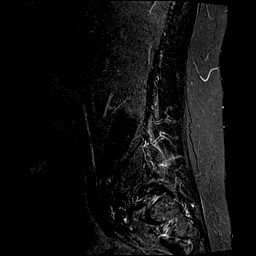
[im 5/15]
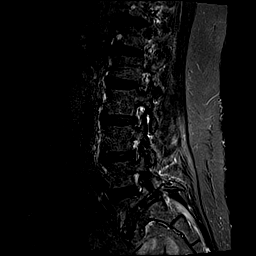
[im 10/15]
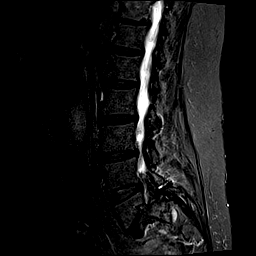
[im 15/15]
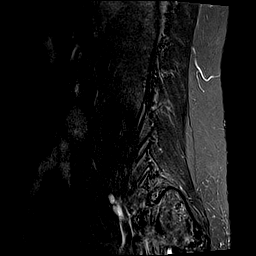

[Series 21: t2_axial · axial · 4.0mm · 0.62mm/px · z∈[-422,-218]mm · 8 of 44 slices shown]
[im 1/44]
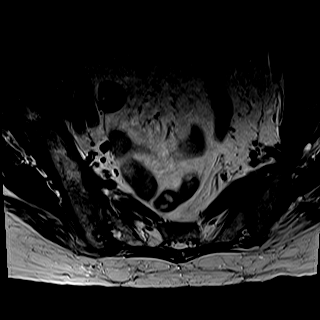
[im 5/44]
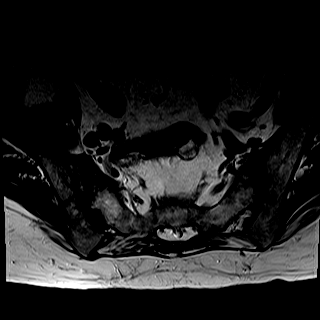
[im 15/44]
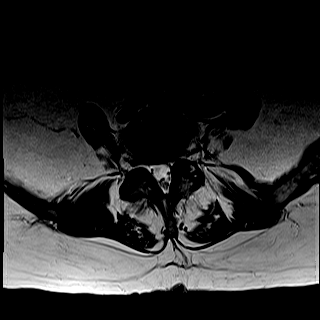
[im 20/44]
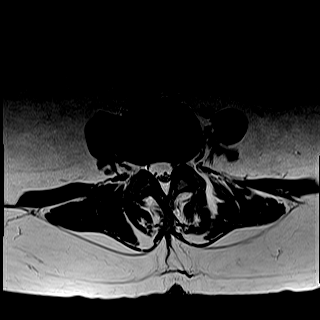
[im 24/44]
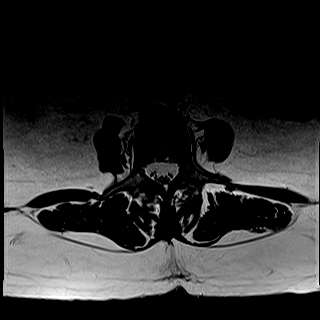
[im 29/44]
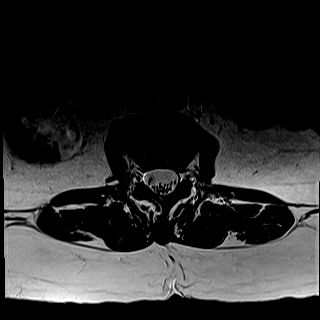
[im 39/44]
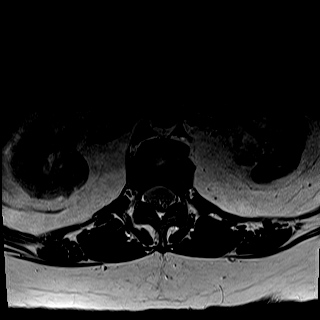
[im 44/44]
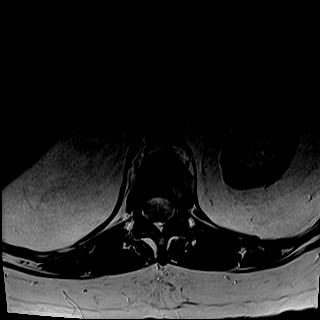

[Series 22: t1_axial_obl · axial · 3.0mm · 0.43mm/px · z∈[-403,-200]mm · 6 of 26 slices shown]
[im 1/26]
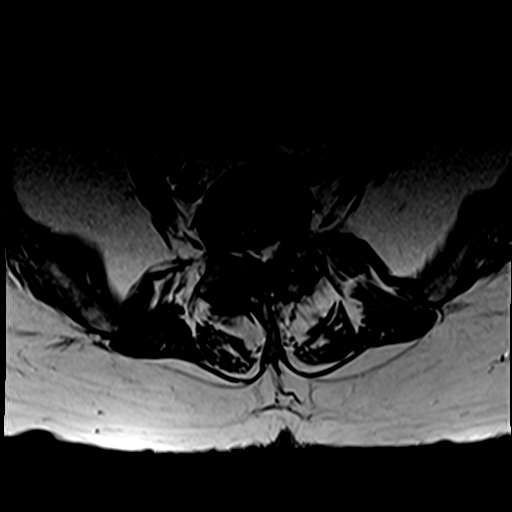
[im 6/26]
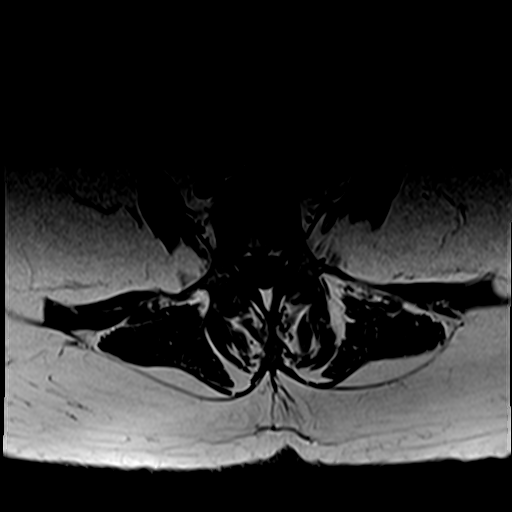
[im 11/26]
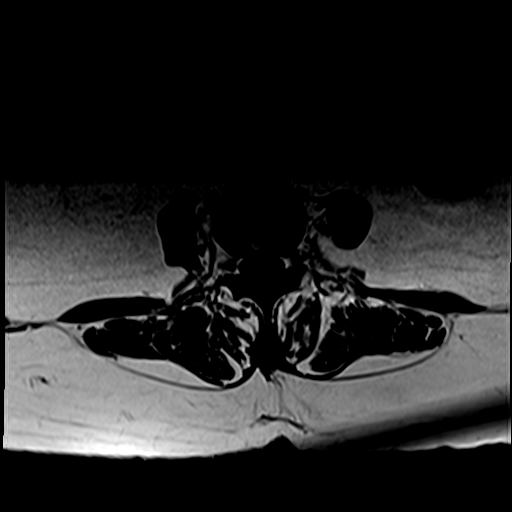
[im 16/26]
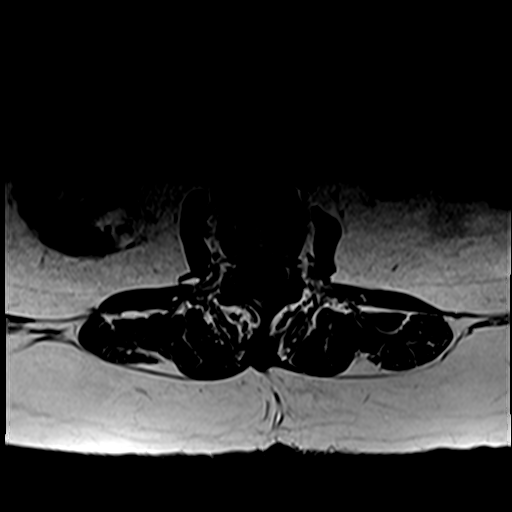
[im 21/26]
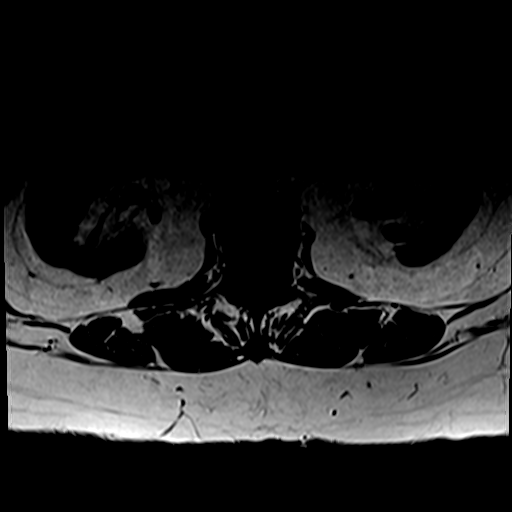
[im 26/26]
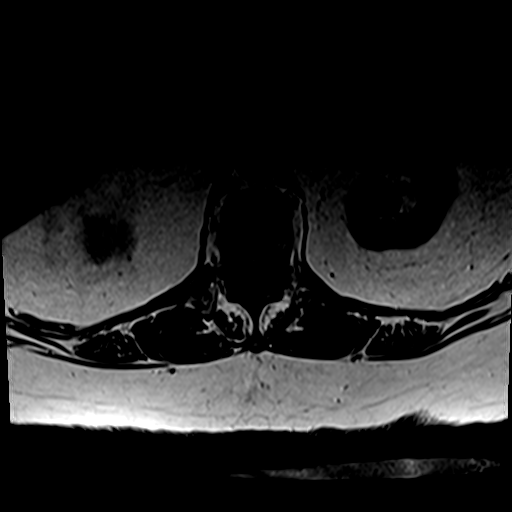

[Series 23: t1_axial_ · axial · 3.0mm · 0.43mm/px · 1 of 10 slices shown]
[im 1/10]
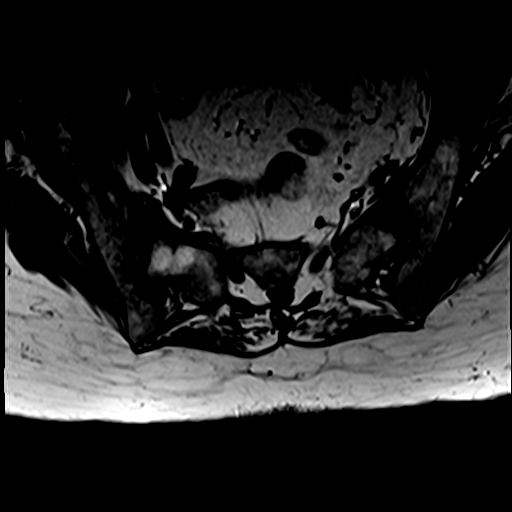

[37 of 48 positions shown; findings below may reference images not displayed]

FINDINGS: 5 nonrib-bearing lumbar vertebral bodies.

Lumbar vertebral body height is well maintained without evidence of a recent compression fracture. No aggressive or destructive osseous lesion.

Redemonstration of bilateral L5 pars interarticularis defects with resultant grade II/III anterolisthesis, unchanged.  The lumbar spine is otherwise maintained.

The conus medullaris is normal in size and signal intensity terminating at the T12-L1 level.

Multilevel, multifactorial degenerative changes of lumbar spine with marked disc desiccation at L5-S1, with associated reactive endplate changes.

Level by level degenerative changes below:

T12-L1: No central spinal canal or neural foraminal stenosis. Unchanged.

L1-L2: No central spinal canal or neural foraminal stenosis. Unchanged.

L2-L3: Small disc bulge. Mild right, greater than left facet arthrosis and ligamentum flavum buckling. Mild contouring thecal sac without central spinal canal stenosis. The bilateral neural foramen are patent. Unchanged.

L3-L4: Small disc bulge, with development of a left proximal foraminal disc extrusion which measures 3 mm AP, extends 5 mm superior to the disc space. Mild facet arthrosis. No central spinal canal stenosis. Development of mild left neural foraminal stenosis. The right neural foramen remains patent.

L4-L5: Mild bilateral facet arthrosis. No central spinal canal or neural foraminal stenosis.

L5-S1: Disc uncovering secondary to grade I/II anterolisthesis. Severe central spinal canal and severe bilateral neural foraminal stenosis, unchanged.

Moderate to severe right and moderate left sacroiliac joint arthrosis.
IMPRESSION: 1.
Unchanged grade II/III anterolisthesis of L5-S1 secondary to bilateral pars interarticularis defects, with resultant severe central spinal canal and severe bilateral neural foraminal stenosis.

2.
Interval development of a small left foraminal disc extrusion at L3-L4 which contributes to mild left neural foraminal stenosis.

3.
Additional level by level details, as described above.

## 2021-11-09 IMAGING — CT CT LSPINE WO CONTRAST
4 of 8 series · 13 of 33 positions shown, 14 images · non-contrast
Comparison: Lumbar spine x-ray study and MRI exam 07/20/2021.

HISTORY: Back pain. Buttock pain.
TECHNIQUE: 2 mm axial images of CT lumbar spine study performed without contrast. Coronal, sagittal and axial disc level reformation images obtained.

Dose reduction technique used: Automated exposure control and adjustment of the mA and/or kV according to patient size. CT count in previous 12-months: 0

[Series 2: soft tissue · axial · 0.33mm/px · z∈[+1436,+1524]mm · 4 of 103 slices shown]
[im 15/103  soft-tissue]
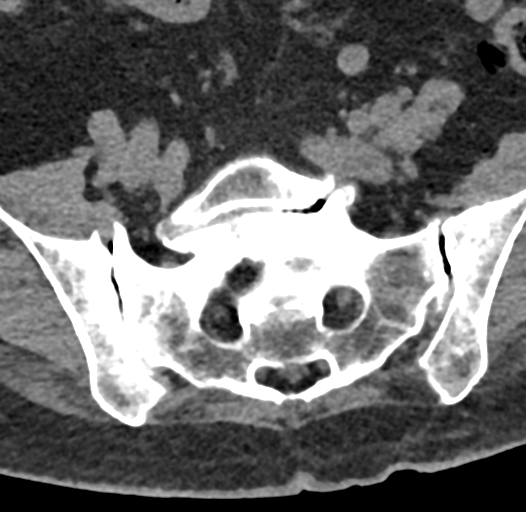
[im 30/103  soft-tissue]
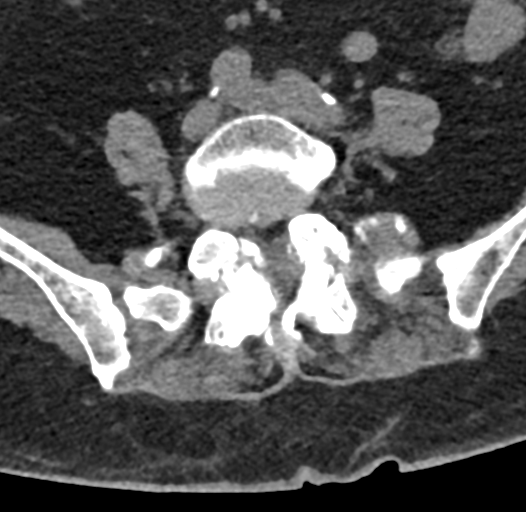
[im 44/103  soft-tissue]
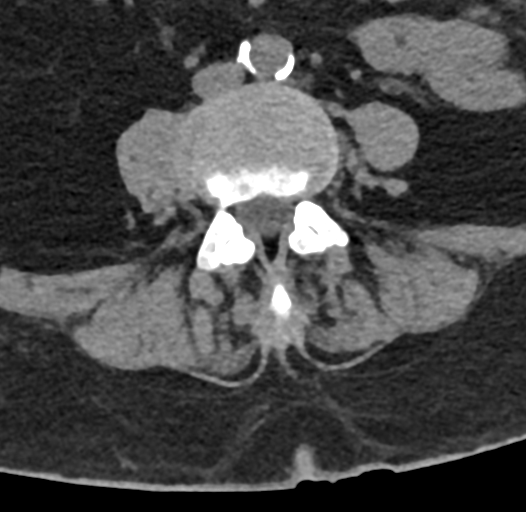
[im 59/103  soft-tissue]
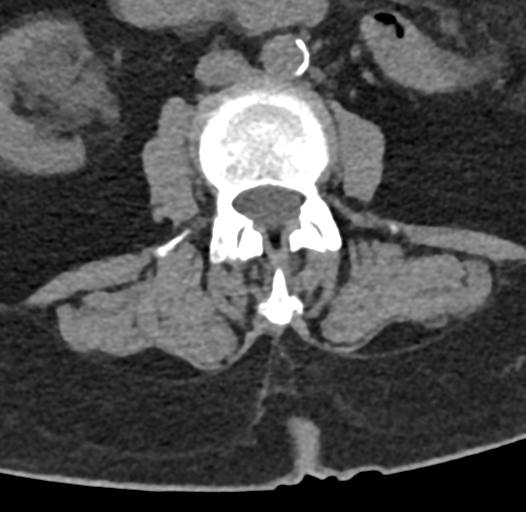

[Series 8: sagittal · sagittal · 0.27mm/px · 5 of 58 slices shown]
[im 10/58  bone]
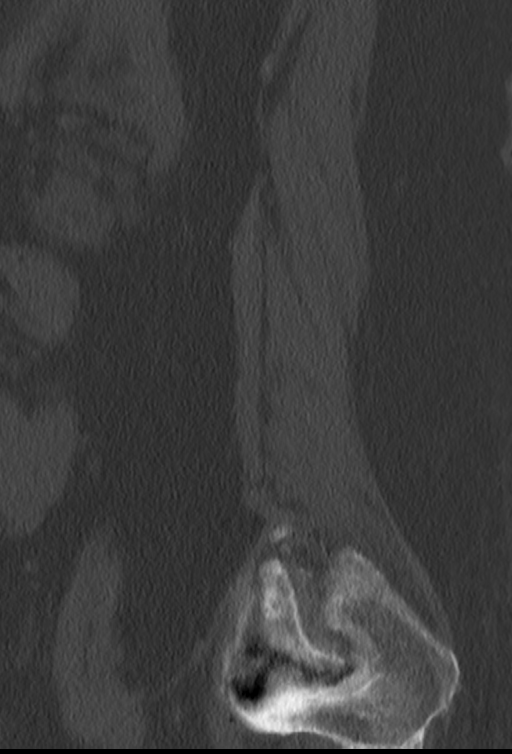
[im 20/58  bone]
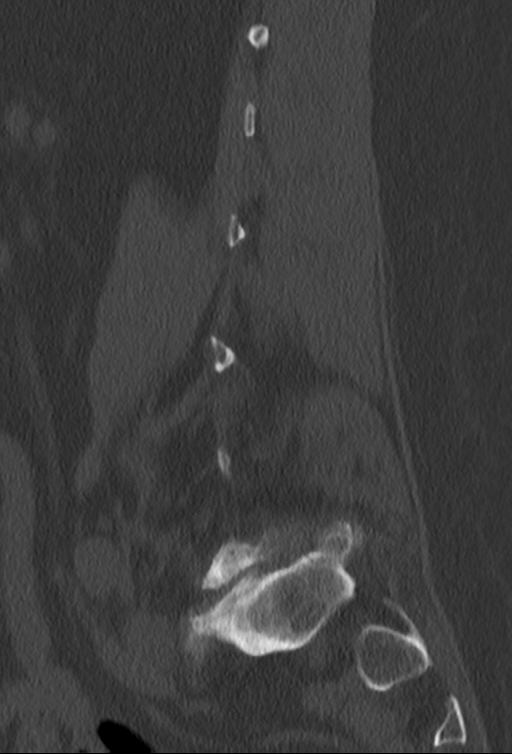
[im 29/58  bone]
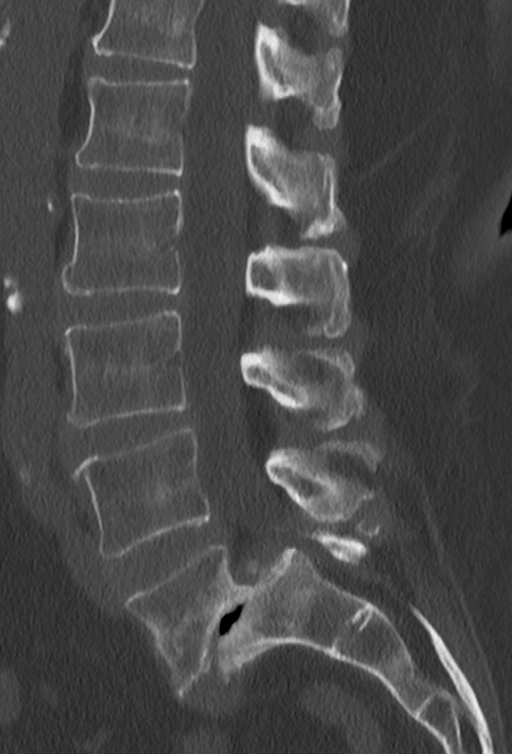
[im 39/58  bone]
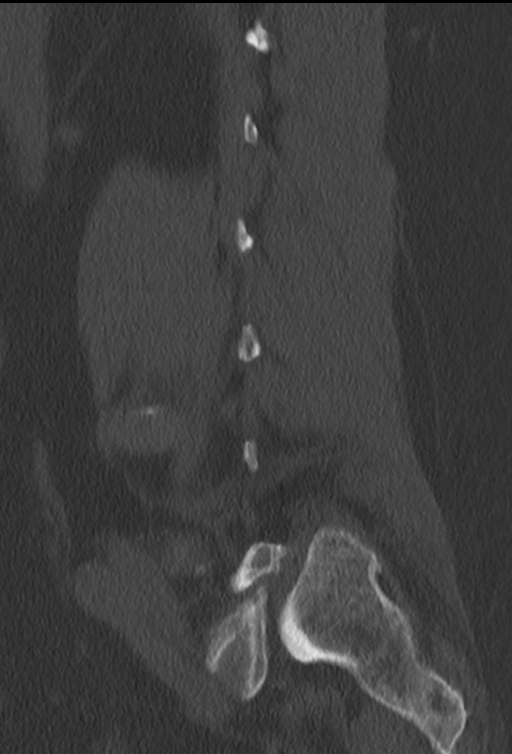
[im 48/58  bone]
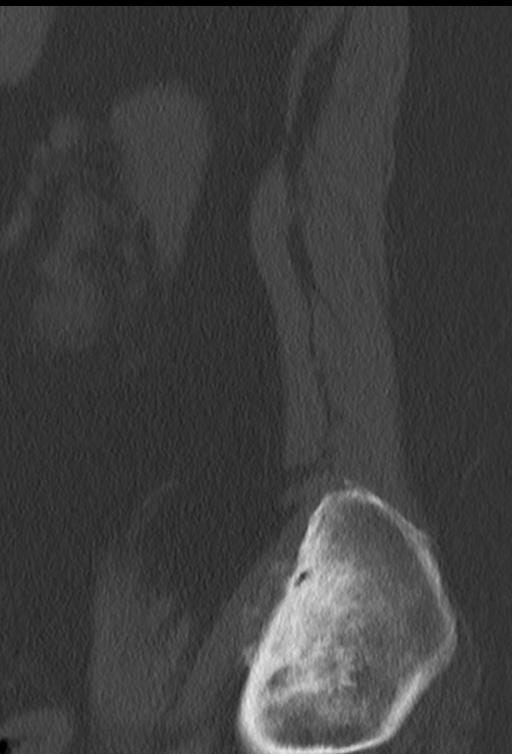

[Series 17: bone_addl · axial · 0.33mm/px · z∈[+1396,+1470]mm · 3 of 38 slices shown, 4 images]
[im 1/38  soft-tissue]
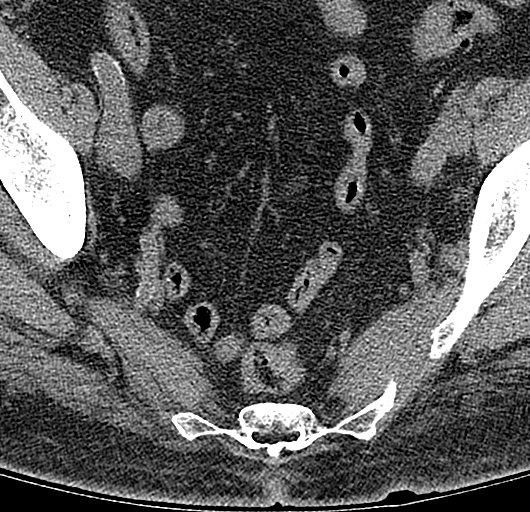
[im 1/38  bone]
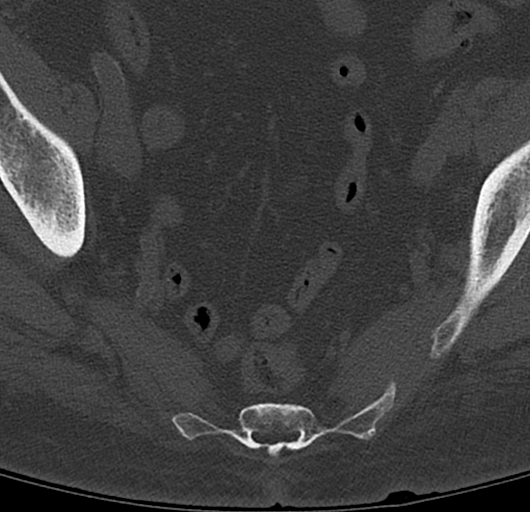
[im 19/38  bone]
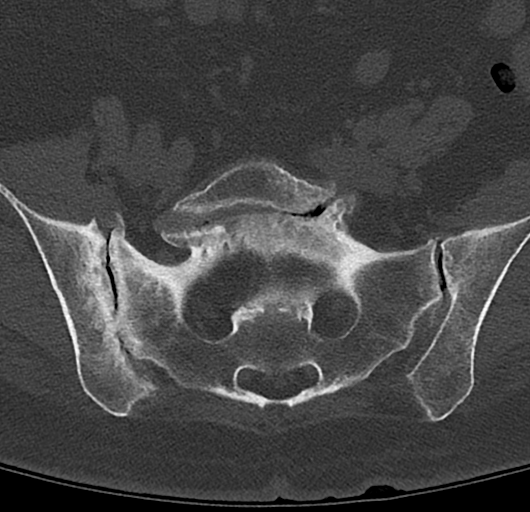
[im 38/38  bone]
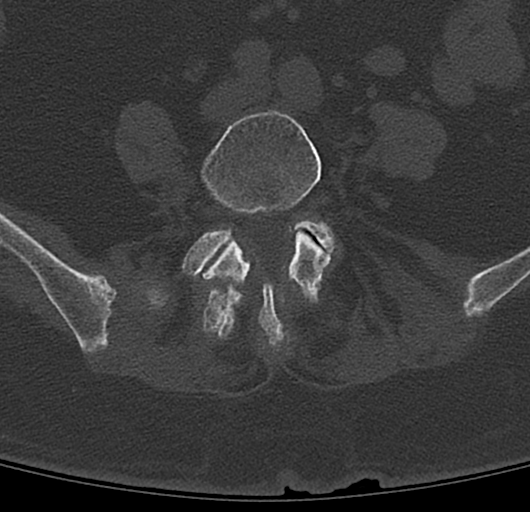

[Series 19: coronal_addl · coronal · 0.15mm/px · 1 of 56 slices shown]
[im 28/56  bone]
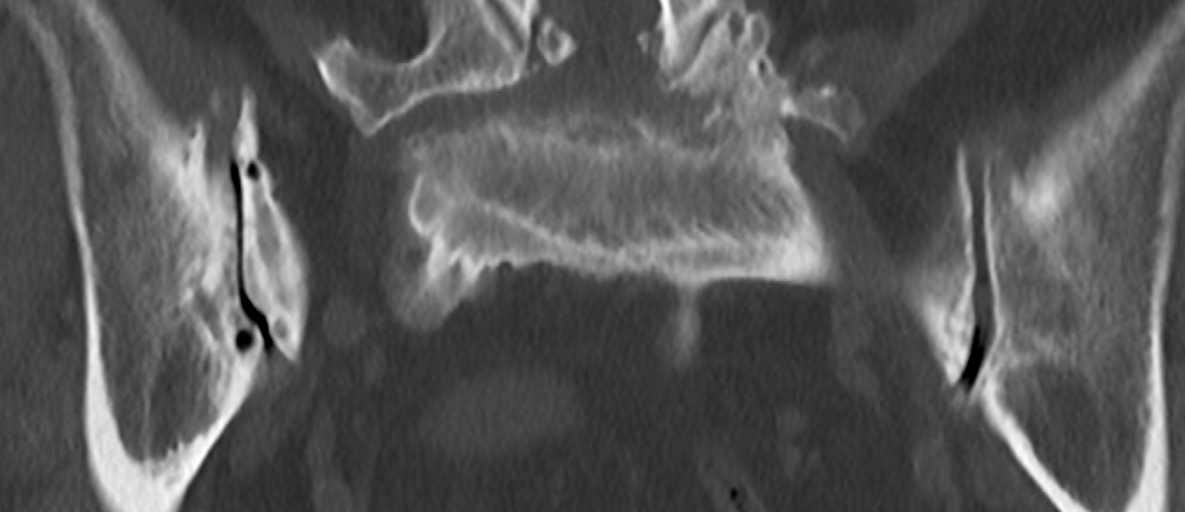

[13 of 33 positions shown; findings below may reference images not displayed]

FINDINGS: 5 nonrib-bearing lumbar vertebrae seen. Bilateral L5 spondylolysis seen. Grade 2 anterior spondylolisthesis of L5 on S1 by 14 mm seen. Mild to large endplate spurs of lumbar spine identified, most prominent at inferior endplate of L5 and superior endplate of S1. Degenerative endplate changes identified. Endplate Schmorl's nodes seen, most prominent at L5-S1 level with concave inferior endplate of L5 posteriorly identified. Right L2 pars interarticularis defect identified. No other area of spondylolisthesis is seen. No acute fracture is seen.

Vacuum disc phenomena at L5-S1 level with severe disc space narrowing identified. Mild disc space narrowing at the T12-L1 level identified. Intervertebral disc height is otherwise maintained.

T12-L1, broad-based disc bulge, mild degenerative disease with mild ligamentum flavum hypertrophy identified, producing overall mild thecal compression and mild spinal canal stenosis. Neural foramina are patent.

L1-L2, broad-based disc bulge, mild degenerative disease with mild ligamentum flavum hypertrophy identified, producing overall mild thecal compression and mild spinal canal stenosis. Neural foramina are patent.

L2-L3, broad-based disc bulge, mild degenerative facet disease with mild ligamentum flavum hypertrophy identified, producing overall mild thecal compression and mild spinal canal stenosis with also mild bilateral L2 neural foraminal stenosis.

At L3-L4, broad-based disc bulge, mild degenerative facet disease with mild ligamentum flavum hypertrophy identified, producing overall mild thecal compression and mild spinal canal stenosis as well as mild bilateral L3 neural foraminal stenosis.

L4-L5, broad-based disc bulge, moderate degenerative disease with moderate ligamentum flavum hypertrophy seen, producing overall mild-to-moderate thecal compression and mild to moderate spinal canal stenosis. Mild-to-moderate bilateral L4 neural foraminal stenosis identified.

L5-S1, moderate to severe spinal canal stenosis identified, worse on the right, due to spondylolisthesis and degenerative facet disease. Severe bilateral L5 neural foraminal stenosis due to spondylolisthesis also seen.

Distal thoracic spinal cord and conus are likely normal. No obvious mass lesion of cauda equina seen.

Moderate degenerative changes of SI joints with vacuum phenomena identified. Partial fusion of right-sided SI joint identified.

Calcified plaques of aorta and its branches seen. Increased is subcutaneous adipose  tissue seen.

Coronal, sagittal and axial disc level reformation images confirm above findings.
IMPRESSION: Bilateral L5 spondylolysis with grade 2 anterior spondylolisthesis of L5 on S1 identified.

Right L2 spondylolysis identified without spondylolisthesis.

No acute fracture is identified.

Bilateral mild to severe neural foraminal stenosis identified, most prominent involving bilateral L5 neural foramina.

Mild to severe spinal canal stenosis identified, most prominent at L5-S1 level.

Additional chronic findings as noted.

No other significant interval change since previous lumbar spine MR study.

Total radiation dose to patient is CTDIvol 57.30 mGy and DLP 932.00 mGy-cm.

## 2021-12-09 IMAGING — CR L-SPINE 4 VWS MIN
1 series · 4 of 4 positions shown · non-contrast
Comparison: 07/20/21

Lumbar spine 4 views including standing flexion and extension.
HISTORY: Spinal stenosis of lumbar region without neurogenic claudication.

[Series 1: w lumbar spine ap · 0.15mm/px · 4 of 4 slices shown]
[im 1/4]
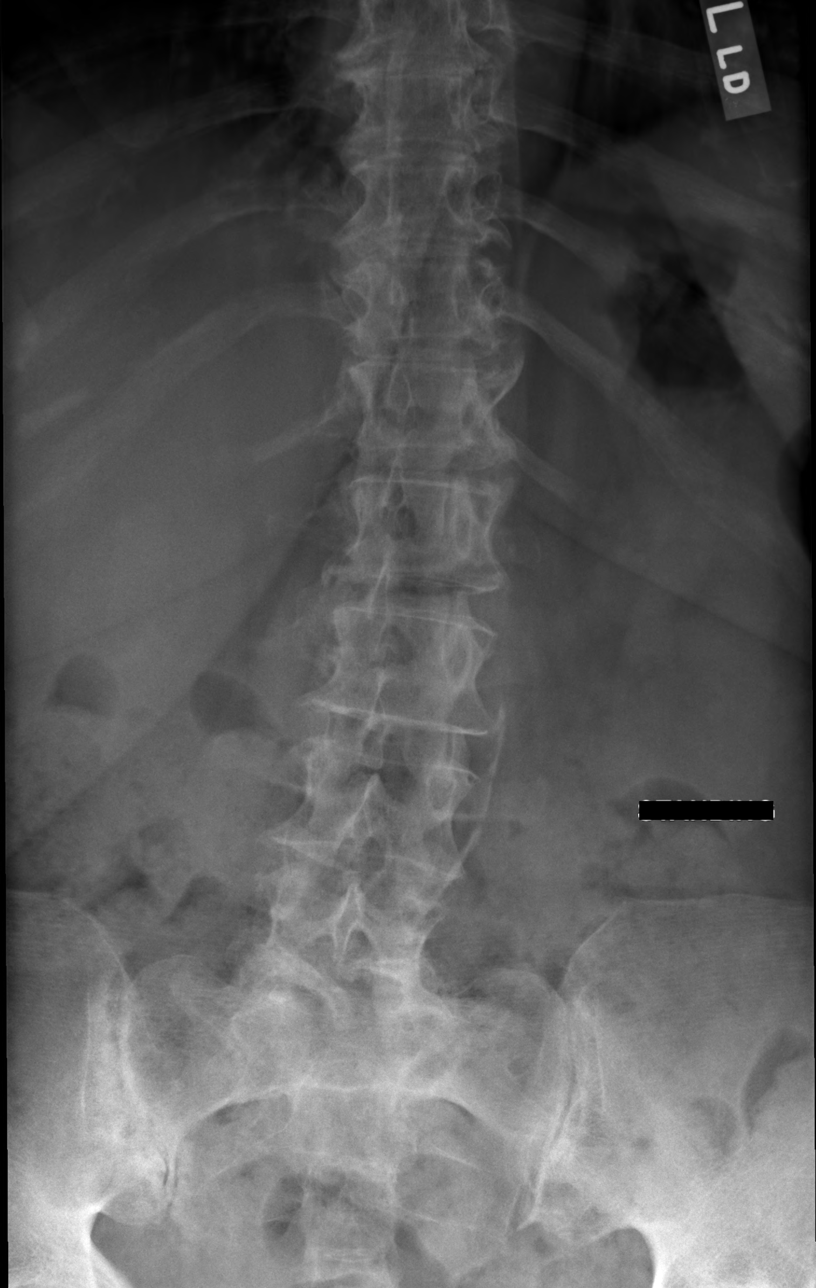
[im 2/4]
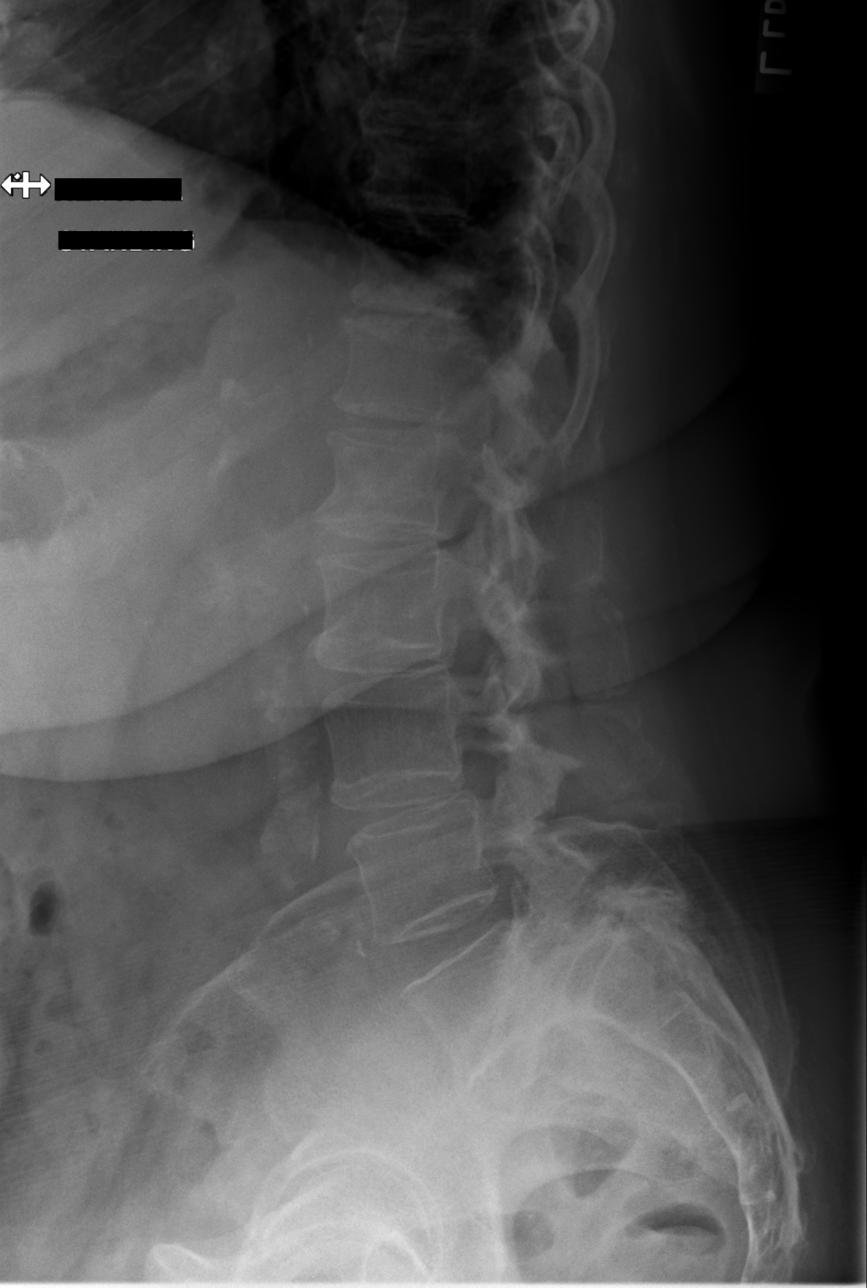
[im 3/4]
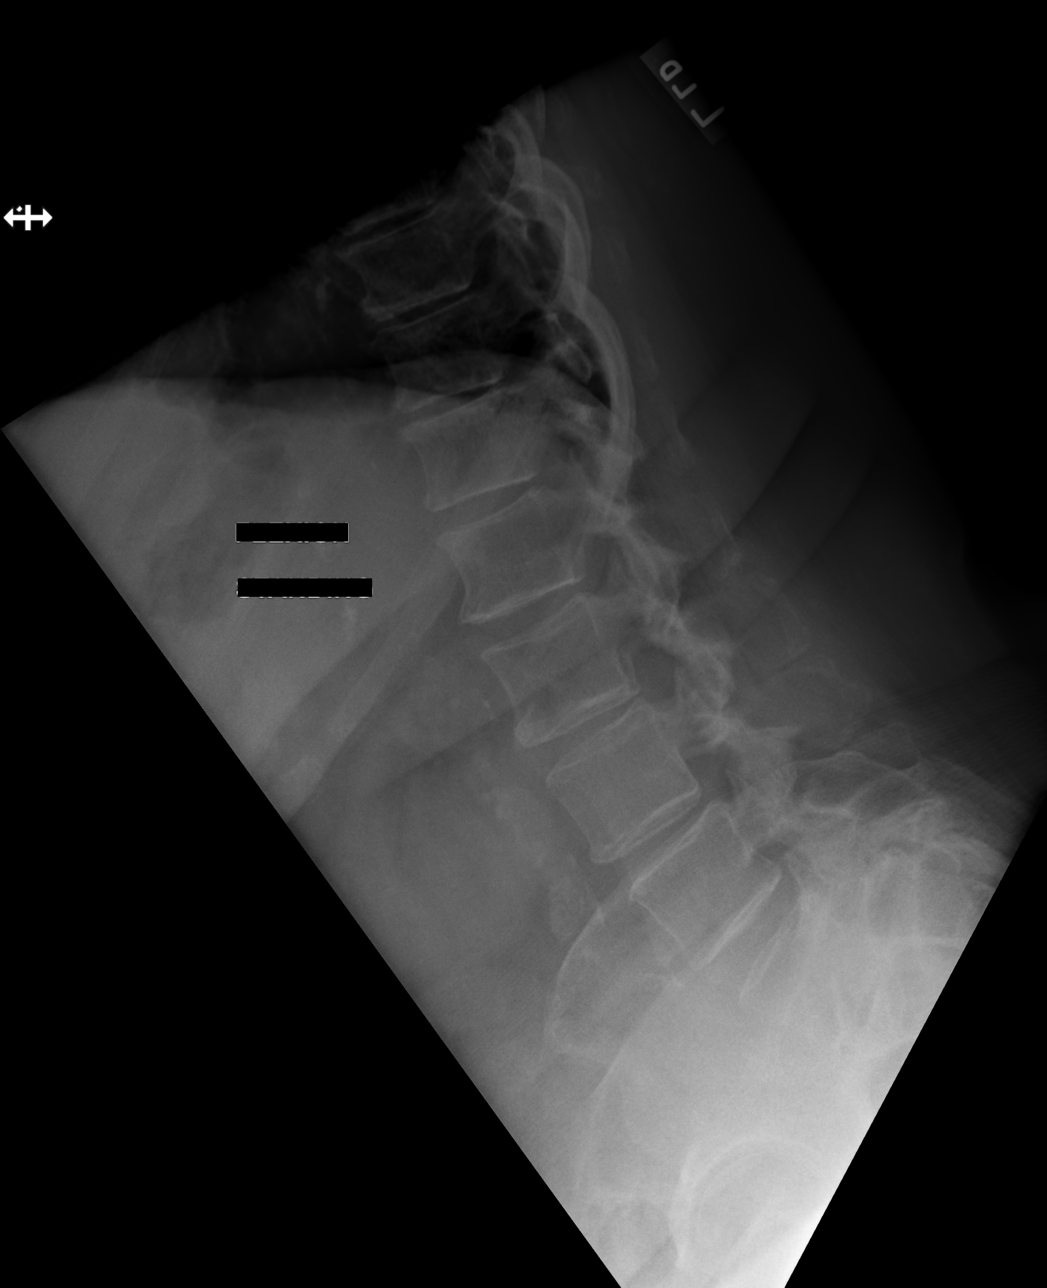
[im 4/4]
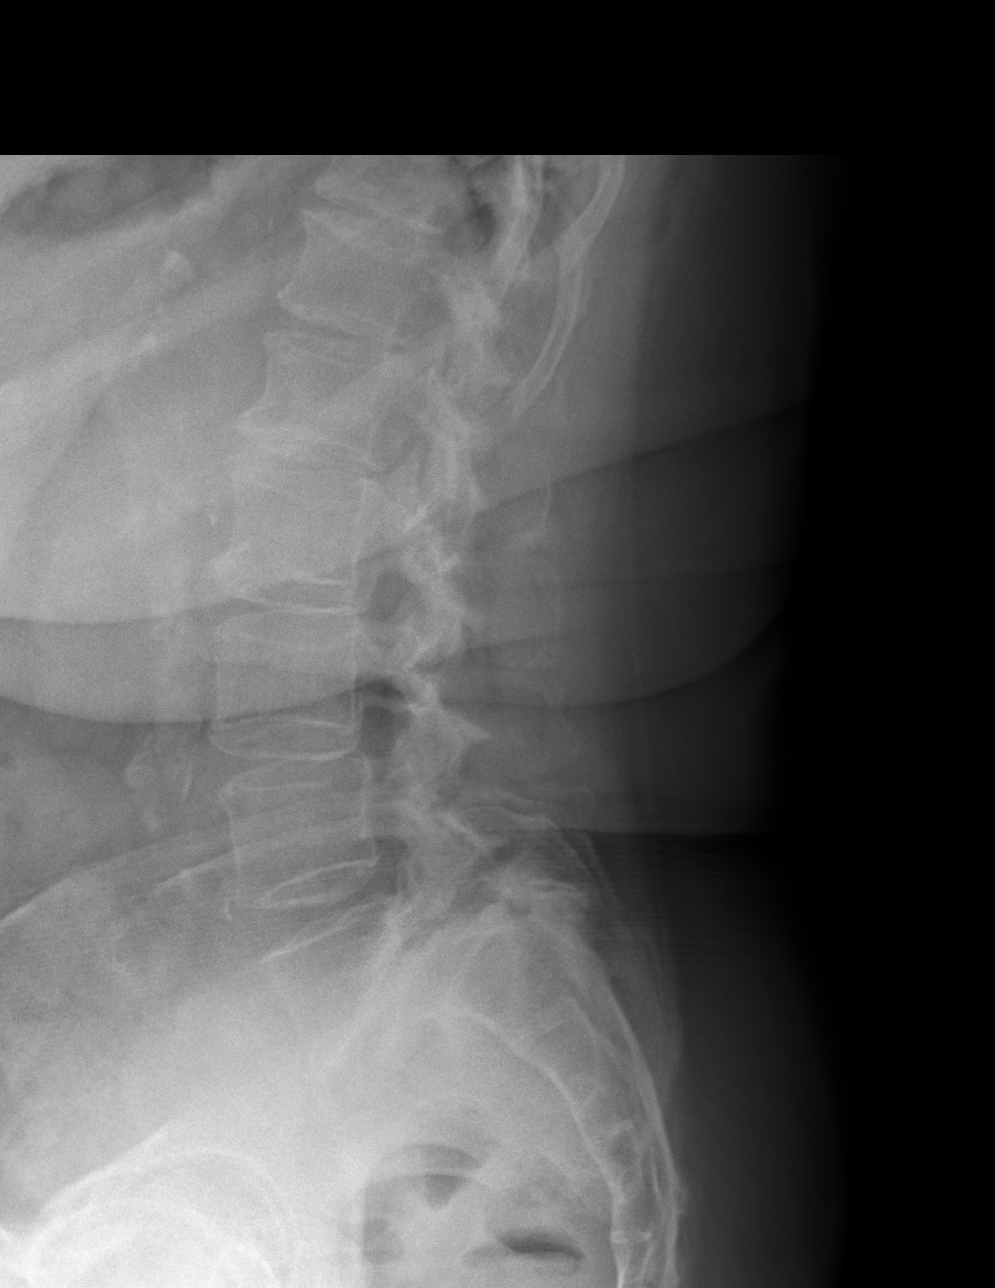

[4 of 4 positions shown; findings below may reference images not displayed]

FINDINGS: There are five lumbar segments. Bilateral L5 spondylolysis with grade 4 spondylolisthesis in the standing position with the middle column of L5 right at the anterior edge of the S1 vertebral body. This looks similar to a 07/09/19 standing study.

Remaining disc levels show mild disease, similar to priors.

Moderate to severe right, mild left SI joint DJD. No wedge compression deformity. Calcified aortoiliac arterial plaques. Slight dextro concave lumbar scoliosis, apex L1-2, and measuring 14 degrees. Subtle pelvic tilt, left iliac crest higher than right.

In between flexion and extension, I see no change in the spondylolisthesis.
IMPRESSION: L5 bilateral spondylolysis with grade 4 spondylolisthesis similar to 07/09/19. No change in between flexion and extension.

## 2022-01-29 ENCOUNTER — Other Ambulatory Visit: Payer: Self-pay | Admitting: Family Medicine

## 2022-01-29 ENCOUNTER — Ambulatory Visit: Payer: 59

## 2022-01-29 DIAGNOSIS — M79645 Pain in left finger(s): Secondary | ICD-10-CM

## 2023-03-05 ENCOUNTER — Encounter (INDEPENDENT_AMBULATORY_CARE_PROVIDER_SITE_OTHER): Payer: Self-pay

## 2023-04-04 ENCOUNTER — Encounter (INDEPENDENT_AMBULATORY_CARE_PROVIDER_SITE_OTHER): Payer: Self-pay

## 2023-05-05 ENCOUNTER — Encounter (INDEPENDENT_AMBULATORY_CARE_PROVIDER_SITE_OTHER): Payer: Self-pay

## 2023-06-05 ENCOUNTER — Encounter (INDEPENDENT_AMBULATORY_CARE_PROVIDER_SITE_OTHER): Payer: Self-pay

## 2023-07-05 ENCOUNTER — Encounter (INDEPENDENT_AMBULATORY_CARE_PROVIDER_SITE_OTHER): Payer: Self-pay

## 2023-08-05 ENCOUNTER — Encounter (INDEPENDENT_AMBULATORY_CARE_PROVIDER_SITE_OTHER): Payer: Self-pay

## 2023-09-04 ENCOUNTER — Encounter (INDEPENDENT_AMBULATORY_CARE_PROVIDER_SITE_OTHER): Payer: Self-pay

## 2023-10-05 ENCOUNTER — Encounter (INDEPENDENT_AMBULATORY_CARE_PROVIDER_SITE_OTHER): Payer: Self-pay

## 2023-11-05 ENCOUNTER — Encounter (INDEPENDENT_AMBULATORY_CARE_PROVIDER_SITE_OTHER): Payer: Self-pay

## 2023-12-03 ENCOUNTER — Encounter (INDEPENDENT_AMBULATORY_CARE_PROVIDER_SITE_OTHER): Payer: Self-pay

## 2024-01-03 ENCOUNTER — Encounter (INDEPENDENT_AMBULATORY_CARE_PROVIDER_SITE_OTHER): Payer: Self-pay

## 2024-02-02 ENCOUNTER — Encounter (INDEPENDENT_AMBULATORY_CARE_PROVIDER_SITE_OTHER): Payer: Self-pay

## 2024-03-04 ENCOUNTER — Encounter (INDEPENDENT_AMBULATORY_CARE_PROVIDER_SITE_OTHER): Payer: Self-pay

## 2024-04-03 ENCOUNTER — Encounter (INDEPENDENT_AMBULATORY_CARE_PROVIDER_SITE_OTHER): Payer: Self-pay

## 2024-05-04 ENCOUNTER — Encounter (INDEPENDENT_AMBULATORY_CARE_PROVIDER_SITE_OTHER): Payer: Self-pay

## 2024-06-04 ENCOUNTER — Encounter (INDEPENDENT_AMBULATORY_CARE_PROVIDER_SITE_OTHER): Payer: Self-pay

## 2024-06-05 ENCOUNTER — Encounter (INDEPENDENT_AMBULATORY_CARE_PROVIDER_SITE_OTHER): Payer: Self-pay

## 2024-07-04 ENCOUNTER — Encounter (INDEPENDENT_AMBULATORY_CARE_PROVIDER_SITE_OTHER): Payer: Self-pay

## 2024-07-05 ENCOUNTER — Encounter (INDEPENDENT_AMBULATORY_CARE_PROVIDER_SITE_OTHER): Payer: Self-pay

## 2024-08-04 ENCOUNTER — Encounter (INDEPENDENT_AMBULATORY_CARE_PROVIDER_SITE_OTHER): Payer: Self-pay

## 2024-08-05 ENCOUNTER — Encounter (INDEPENDENT_AMBULATORY_CARE_PROVIDER_SITE_OTHER): Payer: Self-pay

## 2024-09-03 ENCOUNTER — Encounter (INDEPENDENT_AMBULATORY_CARE_PROVIDER_SITE_OTHER): Payer: Self-pay

## 2024-09-04 ENCOUNTER — Encounter (INDEPENDENT_AMBULATORY_CARE_PROVIDER_SITE_OTHER): Payer: Self-pay

## 2024-10-04 ENCOUNTER — Encounter (INDEPENDENT_AMBULATORY_CARE_PROVIDER_SITE_OTHER): Payer: Self-pay

## 2024-10-05 ENCOUNTER — Encounter (INDEPENDENT_AMBULATORY_CARE_PROVIDER_SITE_OTHER): Payer: Self-pay
